# Patient Record
Sex: Male | Born: 1960 | Race: White | Hispanic: No | Marital: Single | Smoking: Current every day smoker
Health system: Southern US, Community
[De-identification: ages and names within clinical notes are randomized; demographics above are authoritative.]

## PROBLEM LIST (undated history)

## (undated) DIAGNOSIS — F32A Depression, unspecified: Secondary | ICD-10-CM

## (undated) DIAGNOSIS — F419 Anxiety disorder, unspecified: Secondary | ICD-10-CM

## (undated) DIAGNOSIS — F329 Major depressive disorder, single episode, unspecified: Secondary | ICD-10-CM

## (undated) DIAGNOSIS — F609 Personality disorder, unspecified: Secondary | ICD-10-CM

---

## 1999-11-11 ENCOUNTER — Inpatient Hospital Stay (HOSPITAL_COMMUNITY): Admission: AD | Admit: 1999-11-11 | Discharge: 1999-11-16 | Payer: Self-pay

## 1999-11-24 ENCOUNTER — Emergency Department (HOSPITAL_COMMUNITY): Admission: EM | Admit: 1999-11-24 | Discharge: 1999-11-24 | Payer: Self-pay | Admitting: Emergency Medicine

## 2010-10-24 ENCOUNTER — Emergency Department: Payer: Self-pay | Admitting: Emergency Medicine

## 2010-12-27 ENCOUNTER — Ambulatory Visit: Payer: Self-pay | Admitting: Unknown Physician Specialty

## 2011-02-02 ENCOUNTER — Inpatient Hospital Stay: Payer: Self-pay | Admitting: Psychiatry

## 2011-04-29 ENCOUNTER — Emergency Department (HOSPITAL_COMMUNITY)
Admission: EM | Admit: 2011-04-29 | Discharge: 2011-04-30 | Disposition: A | Discharge status: Other Acute Inpatient Hospital | Payer: Self-pay | Attending: Emergency Medicine | Admitting: Emergency Medicine

## 2011-04-29 ENCOUNTER — Encounter: Payer: Self-pay | Admitting: *Deleted

## 2011-04-29 DIAGNOSIS — F17200 Nicotine dependence, unspecified, uncomplicated: Secondary | ICD-10-CM | POA: Insufficient documentation

## 2011-04-29 DIAGNOSIS — R45850 Homicidal ideations: Secondary | ICD-10-CM | POA: Insufficient documentation

## 2011-04-29 DIAGNOSIS — R45851 Suicidal ideations: Secondary | ICD-10-CM | POA: Insufficient documentation

## 2011-04-29 HISTORY — DX: Depression, unspecified: F32.A

## 2011-04-29 HISTORY — DX: Personality disorder, unspecified: F60.9

## 2011-04-29 HISTORY — DX: Major depressive disorder, single episode, unspecified: F32.9

## 2011-04-29 HISTORY — DX: Anxiety disorder, unspecified: F41.9

## 2011-04-29 LAB — BASIC METABOLIC PANEL
BUN: 10 mg/dL (ref 6–23)
CO2: 29 mEq/L (ref 19–32)
Calcium: 8.8 mg/dL (ref 8.4–10.5)
Creatinine, Ser: 0.95 mg/dL (ref 0.50–1.35)
GFR calc non Af Amer: >60 mL/min (ref >60)
Glucose, Bld: 94 mg/dL (ref 70–99)
Sodium: 142 mEq/L (ref 135–145)

## 2011-04-29 LAB — DIFFERENTIAL
Eosinophils Absolute: 0.3 10*3/uL (ref 0.0–0.7)
Eosinophils Relative: 3 % (ref 0–5)
Lymphs Abs: 3.4 10*3/uL (ref 0.7–4.0)
Monocytes Absolute: 0.5 10*3/uL (ref 0.1–1.0)

## 2011-04-29 LAB — URINALYSIS, ROUTINE W REFLEX MICROSCOPIC
Nitrite: NEGATIVE
Protein, ur: NEGATIVE mg/dL
Specific Gravity, Urine: 1.015 (ref 1.005–1.030)
Urobilinogen, UA: 0.2 mg/dL (ref 0.0–1.0)

## 2011-04-29 LAB — RAPID URINE DRUG SCREEN, HOSP PERFORMED
Amphetamines: NOT DETECTED
Barbiturates: NOT DETECTED
Opiates: NOT DETECTED
Tetrahydrocannabinol: NOT DETECTED

## 2011-04-29 LAB — URINE MICROSCOPIC-ADD ON

## 2011-04-29 LAB — CBC
HCT: 46.8 % (ref 39.0–52.0)
MCH: 32.8 pg (ref 26.0–34.0)
MCV: 94.7 fL (ref 78.0–100.0)
Platelets: 229 10*3/uL (ref 150–400)
RBC: 4.94 MIL/uL (ref 4.22–5.81)

## 2011-04-29 MED ORDER — ZIPRASIDONE MESYLATE 20 MG IM SOLR
20.0000 mg | Freq: Once | INTRAMUSCULAR | Status: AC | PRN
  Administered 2011-04-29: 20 mg via INTRAMUSCULAR
  Filled 2011-04-29: qty 20

## 2011-04-29 MED ORDER — LORAZEPAM 1 MG PO TABS: 1.0000 mg | ORAL_TABLET | Freq: Three times a day (TID) | ORAL | Status: DC | PRN

## 2011-04-29 MED ORDER — ZOLPIDEM TARTRATE 5 MG PO TABS: 5.0000 mg | ORAL_TABLET | Freq: Every evening | ORAL | Status: DC | PRN

## 2011-04-29 MED ORDER — ONDANSETRON HCL 4 MG PO TABS: 4.0000 mg | ORAL_TABLET | Freq: Three times a day (TID) | ORAL | Status: DC | PRN

## 2011-04-29 MED ORDER — ALUM & MAG HYDROXIDE-SIMETH 200-200-20 MG/5ML PO SUSP: 30.0000 mL | ORAL | Status: DC | PRN

## 2011-04-29 MED ORDER — IBUPROFEN 400 MG PO TABS: 400.0000 mg | ORAL_TABLET | Freq: Three times a day (TID) | ORAL | Status: DC | PRN

## 2011-04-29 MED ORDER — NICOTINE 21 MG/24HR TD PT24: 21.0000 mg | MEDICATED_PATCH | Freq: Every day | TRANSDERMAL | Status: DC

## 2011-04-29 MED ORDER — ACETAMINOPHEN 325 MG PO TABS: 650.0000 mg | ORAL_TABLET | ORAL | Status: DC | PRN

## 2011-04-29 NOTE — ED Provider Notes (Signed)
Scribed for Derek Melter, MD, the patient was seen in room APA03/APA03. This chart was scribed by [employer]. The patient's care started at 18:50  CSN: [id number] Arrival date & time: 04/29/2011  6:47 PM   Chief Complaint  Patient presents with  . Suicidal   HPI Derek Weaver is a 50 y.o. male who presents to the Emergency Department complaining of Suicidal attempt. Patient reports he just cannot deal with all the stress of life that others seem to control. Patient reports HI/SI with plans. States he plans on cutting himself with a razor blade and then take some sleeping pills. Patient also reports some Homicidal ideations. Patient reports leaving the halfway house this morning and has been thinking of how he'd hurt himself all day. Patient reports tobacco use, alcohol use and prescription use. Denies a history of surgery, back pain, abdominal pain, and is without any other complain.  Past Medical History  Diagnosis Date  . Depression   . Anxiety   . Personality disorder     History reviewed. No pertinent past surgical history.  History reviewed. No pertinent family history.  History  Substance Use Topics  . Smoking status: Current Everyday Smoker -- 0.5 packs/day  . Smokeless tobacco: Not on file  . Alcohol Use: 1.8 oz/week    3 Cans of beer per week      Review of Systems  Constitutional: Negative for fever.       10 Systems reviewed and are negative for acute change except as noted in the HPI.  HENT: Negative for congestion.   Eyes: Negative for discharge and redness.  Respiratory: Negative for cough and shortness of breath.   Cardiovascular: Negative for chest pain.  Gastrointestinal: Negative for vomiting and abdominal pain.  Musculoskeletal: Negative for back pain.  Skin: Negative for rash.  Neurological: Negative for syncope, numbness and headaches.  Psychiatric/Behavioral:       Suicidal and Homicidal ideations and plan  All other systems reviewed and are  negative.    Allergies  Review of patient's allergies indicates no known allergies.  Home Medications  No current outpatient prescriptions on file.  BP 147/83  Pulse 111  Temp(Src) 98.8 F (37.1 C) (Oral)  Resp 20  SpO2 97%  Physical Exam  Nursing note and vitals reviewed. Constitutional: He is oriented to person, place, and time. He appears well-developed and well-nourished.       Awake, alert, nontoxic appearance with baseline speech for patient.  HENT:  Head: Normocephalic.  Mouth/Throat: Oropharynx is clear and moist. No oropharyngeal exudate.  Eyes: Conjunctivae and EOM are normal. Pupils are equal, round, and reactive to light. Right eye exhibits no discharge. Left eye exhibits no discharge.  Neck: Normal range of motion. Neck supple.  Cardiovascular: Normal rate and regular rhythm.   No murmur heard. Pulmonary/Chest: Effort normal and breath sounds normal. No stridor. No respiratory distress. He has no wheezes. He has no rales. He exhibits no tenderness.  Abdominal: Soft. Bowel sounds are normal. He exhibits no mass. There is no tenderness. There is no rebound.  Musculoskeletal: Normal range of motion. He exhibits no tenderness.       Cervical back: Normal.       Thoracic back: Normal.       Lumbar back: Normal.       Baseline ROM, moves extremities with no obvious new focal weakness.  Lymphadenopathy:    He has no cervical adenopathy.  Neurological: He is alert and oriented to person, place,  and time. No cranial nerve deficit.       Awake, alert, cooperative and aware of situation; motor strength bilaterally; sensation normal to light touch bilaterally; peripheral visual fields full to confrontation; no facial asymmetry; tongue midline; major cranial nerves appear intact; no pronator drift, normal finger to nose bilaterally, baseline gait without new ataxia.  Skin: Skin is warm and dry. No rash noted. He is not diaphoretic. No erythema.  Psychiatric: He has a normal  mood and affect.       Patient is tearful    ED Course  Procedures  OTHER DATA REVIEWED: Nursing notes, vital signs, and past medical records reviewed.    DIAGNOSTIC STUDIES: Oxygen Saturation is 97% on room air, normal by my interpretation.    LABS / RADIOLOGY:  Results for orders placed during the hospital encounter of 04/29/11  CBC      Component Value Range   WBC 9.5  4.0 - 10.5 (K/uL)   RBC 4.94  4.22 - 5.81 (MIL/uL)   Hemoglobin 16.2  13.0 - 17.0 (g/dL)   HCT 29.5  62.1 - 30.8 (%)   MCV 94.7  78.0 - 100.0 (fL)   MCH 32.8  26.0 - 34.0 (pg)   MCHC 34.6  30.0 - 36.0 (g/dL)   RDW 65.7  84.6 - 96.2 (%)   Platelets 229  150 - 400 (K/uL)  DIFFERENTIAL      Component Value Range   Neutrophils Relative 56  43 - 77 (%)   Neutro Abs 5.3  1.7 - 7.7 (K/uL)   Lymphocytes Relative 35  12 - 46 (%)   Lymphs Abs 3.4  0.7 - 4.0 (K/uL)   Monocytes Relative 5  3 - 12 (%)   Monocytes Absolute 0.5  0.1 - 1.0 (K/uL)   Eosinophils Relative 3  0 - 5 (%)   Eosinophils Absolute 0.3  0.0 - 0.7 (K/uL)   Basophils Relative 0  0 - 1 (%)   Basophils Absolute 0.0  0.0 - 0.1 (K/uL)  BASIC METABOLIC PANEL      Component Value Range   Sodium 142  135 - 145 (mEq/L)   Potassium 3.4 (*) 3.5 - 5.1 (mEq/L)   Chloride 104  96 - 112 (mEq/L)   CO2 29  19 - 32 (mEq/L)   Glucose, Bld 94  70 - 99 (mg/dL)   BUN 10  6 - 23 (mg/dL)   Creatinine, Ser 9.52  0.50 - 1.35 (mg/dL)   Calcium 8.8  8.4 - 84.1 (mg/dL)   GFR calc non Af Amer >60  >60 (mL/min)   GFR calc Af Amer >60  >60 (mL/min)  ETHANOL      Component Value Range   Alcohol, Ethyl (B) 185 (*) 0 - 11 (mg/dL)  URINE RAPID DRUG SCREEN (HOSP PERFORMED)      Component Value Range   Opiates NONE DETECTED  NONE DETECTED    Cocaine NONE DETECTED  NONE DETECTED    Benzodiazepines NONE DETECTED  NONE DETECTED    Amphetamines NONE DETECTED  NONE DETECTED    Tetrahydrocannabinol NONE DETECTED  NONE DETECTED    Barbiturates NONE DETECTED  NONE DETECTED     URINALYSIS, ROUTINE W REFLEX MICROSCOPIC      Component Value Range   Color, Urine YELLOW  YELLOW    Appearance CLEAR  CLEAR    Specific Gravity, Urine 1.015  1.005 - 1.030    pH 6.5  5.0 - 8.0    Glucose, UA NEGATIVE  NEGATIVE (mg/dL)   Hgb urine dipstick TRACE (*) NEGATIVE    Bilirubin Urine NEGATIVE  NEGATIVE    Ketones, ur NEGATIVE  NEGATIVE (mg/dL)   Protein, ur NEGATIVE  NEGATIVE (mg/dL)   Urobilinogen, UA 0.2  0.0 - 1.0 (mg/dL)   Nitrite NEGATIVE  NEGATIVE    Leukocytes, UA NEGATIVE  NEGATIVE   URINE MICROSCOPIC-ADD ON      Component Value Range   WBC, UA 0-2  <3 (WBC/hpf)   RBC / HPF 0-2  <3 (RBC/hpf)   Bacteria, UA RARE  RARE      ED COURSE / COORDINATION OF CARE: 19:00 - EDMD examined patient and ordered the following Orders Placed This Encounter  Procedures  . CBC  . Differential  . Basic metabolic panel  . Ethanol  . Urine rapid drug screen (hosp performed)  . Urinalysis, Routine w reflex microscopic  . Diet regular  . Vital signs with O2 sat q6hours  . Call MD/PA if: (See Comments)  . Full code  19:44 - Patient attempted to leave against medical advice. EDP alerted local police. Commitment papers filled out.    MDM: SI/HI with hx of same. Pt needs assessment by [facility], and likely placement.  IMPRESSION: Diagnoses that have been ruled out:  Diagnoses that are still under consideration:  Final diagnoses:    PLAN:  pending  CONDITION ON DISCHARGE: pending  MEDICATIONS GIVEN IN THE E.D. Medications - No data to display  DISCHARGE MEDICATIONS: New Prescriptions   No medications on file    SCRIBE ATTESTATION:I personally performed the services described in this documentation, which was scribed in my presence. The recorded information has been reviewed and considered. No att. providers found   Derek Melter, MD 04/30/11 1515

## 2011-04-29 NOTE — ED Notes (Addendum)
Pt states he has been having suicidal thoughts today and thoughts of cutting himself. Pt states he had decided to leave the halfway house he has been staying at d/t stress. Pt is tearful and withdrawn. Pt states he feels like there is nothing left for him. Pt has a plan he was going to use a razor that he has on him to cut himself and take all the pills he has with him.

## 2011-04-29 NOTE — ED Notes (Signed)
Pt attempting to leave ama dr notified commitment papers filed out, police called. Pt stating "i cant stop the cravings i want to cut".

## 2011-04-30 ENCOUNTER — Inpatient Hospital Stay (HOSPITAL_COMMUNITY)
Admission: EM | Admit: 2011-04-30 | Discharge: 2011-05-17 | DRG: 897 | Disposition: A | Discharge status: Home / Self Care | Payer: 59 | Source: Other Acute Inpatient Hospital | Attending: Psychiatry | Admitting: Psychiatry

## 2011-04-30 DIAGNOSIS — F1020 Alcohol dependence, uncomplicated: Principal | ICD-10-CM

## 2011-04-30 DIAGNOSIS — Z79899 Other long term (current) drug therapy: Secondary | ICD-10-CM

## 2011-04-30 DIAGNOSIS — F39 Unspecified mood [affective] disorder: Secondary | ICD-10-CM

## 2011-04-30 DIAGNOSIS — F603 Borderline personality disorder: Secondary | ICD-10-CM

## 2011-04-30 DIAGNOSIS — R45851 Suicidal ideations: Secondary | ICD-10-CM

## 2011-04-30 NOTE — Progress Notes (Signed)
0130 Patient with SI/HI. IVC paperwork and holding orders on the chart. He has been seen by [facility]. Awaiting placement. 1610 Patient accepted at [facility], Dr. [name].

## 2011-05-01 DIAGNOSIS — F1020 Alcohol dependence, uncomplicated: Secondary | ICD-10-CM
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
## 2011-05-19 NOTE — Discharge Summary (Signed)
NAMEMarland Kitchen  Derek, Weaver NO.:  [id number]  MEDICAL RECORD NO.:  [id number]  LOCATION:  0304                          FACILITY:  [facility]  PHYSICIAN:  Orson Aloe, MD       DATE OF BIRTH:  1960/10/11  DATE OF ADMISSION:  04/30/2011 DATE OF DISCHARGE:  05/17/2011                              DISCHARGE SUMMARY   IDENTIFYING INFORMATION:  This is a 50 year old divorced Caucasian male. This is an involuntary admission.  HISTORY OF PRESENT ILLNESS:  This is the first [facility] admission for Derek Weaver who has been staying at a halfway house in [place] since his discharge from the [facility] program.  He relapsed on alcohol yesterday after some conflict with a peer and presented in the emergency room with an alcohol level of 185 mg/dL.  He has a history of cutting himself and did not start cutting but was having suicidal thoughts.  He felt he had nothing to live for.  MEDICAL EVALUATION:  He was medically evaluated in our emergency room where he presented with an alcohol level of 185 mg/dL.  Urine drug screen was negative, and physical exam and full review of systems are as documented in the transcript.  Physical exam was unremarkable.  COURSE OF HOSPITALIZATION:  He was admitted to our dual diagnosis unit and initially evaluated by Dr. Elsie Saas.  He was initially drowsy but arousable, appropriately groomed and dressed with normal speech, denying any homicidal thoughts towards his peer and endorsing passive suicidal thoughts but no active plan.  He was given a provisional diagnosis of alcohol abuse and mood disorder NOS.  He also reported he had a history of borderline personality disorder.  He reported he was a habitual felon.  We elected to continue his involuntary commitment.  He reported that he had been taking Topamax which had been prescribed for him to help control his impulses to cut himself.  He felt that it had helped somewhat but that it could be better.  We elected  to increase his Topamax to 50 mg t.i.d. from his previous dose of 50 mg b.i.d.  He presented with hopeless and helpless mood.  He was cooperative while on the unit and compliant with medications.  Participation in group therapy was satisfactory.  He worked with our Sports coach to pursue placement in an alcohol rehab program.  By the 14th, he was denying any suicidal or homicidal thoughts.  By the 17th, he was ready for discharge to go back to the halfway house where he was living.  He admitted that he struggles with fleeting thoughts of suicide but no plan to cut.  He was tolerating medications well with no side effects, and he did complete a Librium detox protocol.  He was started on Risperdal 0.25 mg p.o. b.i.d. to support calm mood with less agitation and less tendency to cut.  He was also started on Celexa 10 mg daily which was titrated to 10 mg in the morning and 20 mg with the evening meal.  He tolerated medications well and was ready for discharge with his suicidality gauged at minimal by October 17th.  DISCHARGE PLAN:  He is to follow up at [facility] on October 19th at 8 a.m.  DISCHARGE DIAGNOSES:  Axis I: 1. Alcohol dependence. 2. Mood disorder, NOS. Axis II:  Borderline personality disorder. Axis III:  No diagnosis. Axis IV:  Moderate chronic limited support system. Axis V:  Current GAF 55, past year not known.  DISCHARGE MEDICATIONS: 1. Citalopram, take 10 mg q.a.m. and 20 mg every p.m. by mouth. 2. Risperdal 0.25 mg b.i.d. 3. Topiramate 50 mg t.i.d. 4. Trazodone 100 mg at bedtime for insomnia.     Margaret A. Lorin Picket, N.P.   ______________________________ Orson Aloe, MD    MAS/MEDQ  D:  05/17/2011  T:  05/17/2011  Job:  [id number]  Electronically Signed by Kari Baars N.P. on 05/18/2011 08:27:37 AM Electronically Signed by Orson Aloe  on 05/19/2011 10:22:39 AM

## 2011-08-02 ENCOUNTER — Other Ambulatory Visit: Payer: Self-pay

## 2011-08-02 ENCOUNTER — Encounter (HOSPITAL_COMMUNITY): Payer: Self-pay | Admitting: Psychiatry

## 2011-08-02 ENCOUNTER — Inpatient Hospital Stay (HOSPITAL_COMMUNITY)
Admission: AD | Admit: 2011-08-02 | Discharge: 2011-08-07 | DRG: 897 | Disposition: A | Discharge status: Home / Self Care | Payer: Federal, State, Local not specified - Other | Source: Ambulatory Visit | Attending: Psychiatry | Admitting: Psychiatry

## 2011-08-02 ENCOUNTER — Emergency Department (HOSPITAL_COMMUNITY)
Admission: EM | Admit: 2011-08-02 | Discharge: 2011-08-02 | Disposition: A | Discharge status: Other Acute Inpatient Hospital | Payer: Self-pay | Attending: Emergency Medicine | Admitting: Emergency Medicine

## 2011-08-02 ENCOUNTER — Encounter (HOSPITAL_COMMUNITY): Payer: Self-pay | Admitting: Emergency Medicine

## 2011-08-02 DIAGNOSIS — F1210 Cannabis abuse, uncomplicated: Principal | ICD-10-CM

## 2011-08-02 DIAGNOSIS — F1020 Alcohol dependence, uncomplicated: Secondary | ICD-10-CM

## 2011-08-02 DIAGNOSIS — F17200 Nicotine dependence, unspecified, uncomplicated: Secondary | ICD-10-CM | POA: Insufficient documentation

## 2011-08-02 DIAGNOSIS — Z79899 Other long term (current) drug therapy: Secondary | ICD-10-CM

## 2011-08-02 DIAGNOSIS — F609 Personality disorder, unspecified: Secondary | ICD-10-CM | POA: Insufficient documentation

## 2011-08-02 DIAGNOSIS — F603 Borderline personality disorder: Secondary | ICD-10-CM

## 2011-08-02 DIAGNOSIS — F329 Major depressive disorder, single episode, unspecified: Secondary | ICD-10-CM | POA: Insufficient documentation

## 2011-08-02 DIAGNOSIS — F411 Generalized anxiety disorder: Secondary | ICD-10-CM | POA: Insufficient documentation

## 2011-08-02 DIAGNOSIS — F1994 Other psychoactive substance use, unspecified with psychoactive substance-induced mood disorder: Secondary | ICD-10-CM

## 2011-08-02 DIAGNOSIS — R45851 Suicidal ideations: Secondary | ICD-10-CM

## 2011-08-02 DIAGNOSIS — F3289 Other specified depressive episodes: Secondary | ICD-10-CM

## 2011-08-02 DIAGNOSIS — T148XXA Other injury of unspecified body region, initial encounter: Secondary | ICD-10-CM | POA: Insufficient documentation

## 2011-08-02 DIAGNOSIS — F489 Nonpsychotic mental disorder, unspecified: Secondary | ICD-10-CM | POA: Insufficient documentation

## 2011-08-02 DIAGNOSIS — F39 Unspecified mood [affective] disorder: Secondary | ICD-10-CM

## 2011-08-02 DIAGNOSIS — X789XXA Intentional self-harm by unspecified sharp object, initial encounter: Secondary | ICD-10-CM | POA: Insufficient documentation

## 2011-08-02 LAB — RAPID URINE DRUG SCREEN, HOSP PERFORMED
Amphetamines: NOT DETECTED
Barbiturates: NOT DETECTED
Tetrahydrocannabinol: POSITIVE — AB

## 2011-08-02 LAB — CBC
Hemoglobin: 14.7 g/dL (ref 13.0–17.0)
MCH: 32 pg (ref 26.0–34.0)
MCV: 95 fL (ref 78.0–100.0)
Platelets: 203 10*3/uL (ref 150–400)
RBC: 4.6 MIL/uL (ref 4.22–5.81)
WBC: 8.3 10*3/uL (ref 4.0–10.5)

## 2011-08-02 LAB — COMPREHENSIVE METABOLIC PANEL
ALT: 17 U/L (ref 0–53)
AST: 17 U/L (ref 0–37)
CO2: 26 mEq/L (ref 19–32)
Calcium: 9.6 mg/dL (ref 8.4–10.5)
Chloride: 103 mEq/L (ref 96–112)
Creatinine, Ser: 0.95 mg/dL (ref 0.50–1.35)
GFR calc Af Amer: >90 mL/min (ref >90)
GFR calc non Af Amer: >90 mL/min (ref >90)
Glucose, Bld: 117 mg/dL — ABNORMAL HIGH (ref 70–99)
Sodium: 139 mEq/L (ref 135–145)
Total Bilirubin: 0.2 mg/dL — ABNORMAL LOW (ref 0.3–1.2)

## 2011-08-02 LAB — SALICYLATE LEVEL: Salicylate Lvl: <2 mg/dL — ABNORMAL LOW (ref 2.8–20.0)

## 2011-08-02 MED ORDER — LORAZEPAM 1 MG PO TABS: 1.0000 mg | ORAL_TABLET | Freq: Three times a day (TID) | ORAL | Status: DC | PRN

## 2011-08-02 MED ORDER — ACETAMINOPHEN 325 MG PO TABS: 650.0000 mg | ORAL_TABLET | ORAL | Status: DC | PRN

## 2011-08-02 MED ORDER — ACETAMINOPHEN 325 MG PO TABS
650.0000 mg | ORAL_TABLET | Freq: Four times a day (QID) | ORAL | Status: DC | PRN
Start: 2011-08-02 — End: 2011-08-07
  Administered 2011-08-03: 650 mg via ORAL

## 2011-08-02 MED ORDER — TRAZODONE HCL 100 MG PO TABS: 100.0000 mg | ORAL_TABLET | Freq: Every day | ORAL | Status: DC

## 2011-08-02 MED ORDER — MAGNESIUM HYDROXIDE 400 MG/5ML PO SUSP: 30.0000 mL | Freq: Every day | ORAL | Status: DC | PRN

## 2011-08-02 MED ORDER — TETANUS-DIPHTH-ACELL PERTUSSIS 5-2.5-18.5 LF-MCG/0.5 IM SUSP
0.5000 mL | Freq: Once | INTRAMUSCULAR | Status: AC
  Administered 2011-08-02: 0.5 mL via INTRAMUSCULAR
  Filled 2011-08-02: qty 0.5

## 2011-08-02 MED ORDER — TRAZODONE HCL 100 MG PO TABS
100.0000 mg | ORAL_TABLET | Freq: Every day | ORAL | Status: DC
  Administered 2011-08-02 – 2011-08-06 (×5): 100 mg via ORAL
  Filled 2011-08-02 (×6): qty 1
  Filled 2011-08-02: qty 14

## 2011-08-02 MED ORDER — CITALOPRAM HYDROBROMIDE 20 MG PO TABS
20.0000 mg | ORAL_TABLET | Freq: Every day | ORAL | Status: DC
  Administered 2011-08-02: 20 mg via ORAL
  Filled 2011-08-02 (×2): qty 1

## 2011-08-02 MED ORDER — RISPERIDONE 0.5 MG PO TABS
0.2500 mg | ORAL_TABLET | Freq: Two times a day (BID) | ORAL | Status: DC
  Administered 2011-08-02: 0.25 mg via ORAL
  Filled 2011-08-02: qty 1

## 2011-08-02 MED ORDER — TOPIRAMATE 25 MG PO TABS
50.0000 mg | ORAL_TABLET | Freq: Two times a day (BID) | ORAL | Status: DC
  Administered 2011-08-02: 50 mg via ORAL
  Filled 2011-08-02: qty 2

## 2011-08-02 MED ORDER — ALUM & MAG HYDROXIDE-SIMETH 200-200-20 MG/5ML PO SUSP: 30.0000 mL | ORAL | Status: DC | PRN

## 2011-08-02 NOTE — ED Notes (Signed)
MD bedside to close wounds

## 2011-08-02 NOTE — Progress Notes (Signed)
Pt flat, depressed cooperative with staff.  Pt complained of pain in right forearm because dressing was too tight.  Pt's right hand was swollen.  Pt's dressing changed and sutures were observed and were dry and intact with some smaller cuts bleeding.  Pt was encouraged to keep his hand/arm elevated to reduce swelling.  Pt stated that he had passive SI but contracted for safety.  Pt isolated to his room and went to sleep.

## 2011-08-02 NOTE — ED Notes (Signed)
[id number]<AV> Expected date:<BR> Expected time:<BR> Means of arrival:<BR> Comments:<BR> EMS/overdose trazadone/self inflicted wounds

## 2011-08-02 NOTE — ED Notes (Signed)
Report to [facility] [place] ED,

## 2011-08-02 NOTE — ED Notes (Signed)
3 bags of belongings placed in left lower cabinet in pt's room and locked-[facility] notified of need for sitter-Dr. Denton Lank in to evaluate pt-pt has been wanded-pt's belongings have been wanded

## 2011-08-02 NOTE — Consult Note (Signed)
Patient Identification:  Derek Weaver Date of Evaluation:  08/02/2011   History of Present Illness:  Derek Weaver is an 51 y.o. male. Pt presenting to [facility] from [facility] with self-inflicted lacerations to both forearms and chest. Pt reported to [facility] that he had taken 10-15 of his Trazadone and Risperdal, but denied this to the EDP. Pt stated "I got tired. I got tired of this life." Pt admitted to having self-harming thoughts all of the time, but denies any AH/VH. Pt was not able to identify any specific stressors or precipitating factor to his SI. Pt admits to using THC 2-3 times a week and states he has been sober for "awhile" citing since 05/07/11. Pt reported he started walking and taking his medication after ever few blocks, would take a few pills. Stated he was on his way walking to [facility] ED, but by the time he got to [facility] he was "really, really tired". Again, pt stated "I'm just so tired of dealing with this life. I don't want to be here no more." Pt reported still feeling this way currently. Denies HI or psychosis.   Patient reported that he did this because of the ongoing multiple distress because of not having calm not liking the place where he lives.  PAST PSYCHIATRIC HISTORY: Apparently he began cutting after he got out  of prison back in 1995. He has had admissions to [facility]. His most  recent admission was August 2012 to [facility].   SOCIAL HISTORY: He states he has 18 months of college. He has been  married and divorced once. He has no children.   FAMILY HISTORY: None.  DATE OF ADMISSION: 04/30/2011  DATE OF DISCHARGE: 05/17/2011  DISCHARGE SUMMARY  IDENTIFYING INFORMATION: This is a 51 year old divorced Caucasian male.  This is an involuntary admission.  HISTORY OF PRESENT ILLNESS: This is the first [facility] admission for Derek Weaver  who has been staying at a halfway house in [place] since his  discharge from the [facility] program. He relapsed on alcohol yesterday  after  some conflict with a peer and presented in the emergency room with  an alcohol level of 185 mg/dL. He has a history of cutting himself and  did not start cutting but was having suicidal thoughts. He felt he had  nothing to live for.  MEDICAL EVALUATION: He was medically evaluated in our emergency room  where he presented with an alcohol level of 185 mg/dL. Urine drug  screen was negative, and physical exam and full review of systems are as  documented in the transcript. Physical exam was  unremarkable.   COURSE OF HOSPITALIZATION: He was admitted to our dual diagnosis unit  and initially evaluated by Dr. Elsie Saas. He was initially drowsy  but arousable, appropriately groomed and dressed with normal speech,  denying any homicidal thoughts towards his peer and endorsing passive  suicidal thoughts but no active plan. He was given a provisional  diagnosis of alcohol abuse and mood disorder NOS. He also reported he  had a history of borderline personality disorder. He reported he was a  habitual felon. We elected to continue his involuntary commitment.  He reported that he had been taking Topamax which had been prescribed  for him to help control his impulses to cut himself. He felt that it  had helped somewhat but that it could be better. We elected to increase  his Topamax to 50 mg t.i.d. from his previous dose of 50 mg b.i.d. He  presented  with hopeless and helpless mood. He was cooperative while on  the unit and compliant with medications. Participation in group therapy  was satisfactory. He worked with our Sports coach to pursue placement  in an alcohol rehab program.  By the 14th, he was denying any suicidal or homicidal thoughts. By the  17th, he was ready for discharge to go back to the halfway house where  he was living. He admitted that he struggles with fleeting thoughts of  suicide but no plan to cut. He was tolerating medications well with no  side effects, and he did  complete a Librium detox protocol. He was  started on Risperdal 0.25 mg p.o. b.i.d. to support calm mood with less  agitation and less tendency to cut. He was also started on Celexa 10 mg  daily which was titrated to 10 mg in the morning and 20 mg with the  evening meal. He tolerated medications well and was ready for discharge  with his suicidality gauged at minimal by October 17th.   DISCHARGE PLAN: He is to follow up at [facility] on October  19th at 8 a.m.   DISCHARGE DIAGNOSES: Axis I:  1. Alcohol dependence.  2. Mood disorder, NOS.  Axis II: Borderline personality disorder.  Axis III: No diagnosis.  Axis IV: Moderate chronic limited support system.  Axis V: Current GAF 55, past year not known.   DISCHARGE MEDICATIONS:  1. Citalopram, take 10 mg q.a.m. and 20 mg every p.m. by mouth.  2. Risperdal 0.25 mg b.i.d.  3. Topiramate 50 mg t.i.d.  4. Trazodone 100 mg at bedtime for insomnia.   Past Medical History:     Past Medical History  Diagnosis Date  . Depression   . Anxiety   . Personality disorder       History reviewed. No pertinent past surgical history.  Filed Vitals:   08/02/11 0720  BP: 128/70  Pulse: 73  Temp: 98.1 F (36.7 C)  Resp: 16    Lab Results:   BMET    Component Value Date/Time   NA 139 08/02/2011 0507   K 3.7 08/02/2011 0507   CL 103 08/02/2011 0507   CO2 26 08/02/2011 0507   GLUCOSE 117* 08/02/2011 0507   BUN 15 08/02/2011 0507   CREATININE 0.95 08/02/2011 0507   CALCIUM 9.6 08/02/2011 0507   GFRNONAA >90 08/02/2011 0507   GFRAA >90 08/02/2011 0507    Allergies: No Known Allergies  Current Medications:  Prior to Admission medications   Medication Sig Start Date End Date Taking? Authorizing Provider  citalopram (CELEXA) 20 MG tablet Take 20 mg by mouth daily.      Historical Provider, MD  risperiDONE (RISPERDAL) 0.25 MG tablet Take 0.25 mg by mouth 2 (two) times daily.      Historical Provider, MD  topiramate (TOPAMAX) 50 MG tablet  Take 50-100 mg by mouth 2 (two) times daily. Pt takes 50mg  in the morning,100mg  in the evening    Historical Provider, MD  traZODone (DESYREL) 100 MG tablet Take 100 mg by mouth at bedtime.      Historical Provider, MD    Social History:    reports that he has been smoking.  He does not have any smokeless tobacco history on file. He reports that he drinks about 1.8 ounces of alcohol per week. He reports that he uses illicit drugs (Marijuana) about 2.5 times per week.   Family History:    No family history on file.  DIAGNOSIS: Mood disorder NOS  Recommendations: Patient started on Celexa Risperdal Topamax and trazodone.  Patient need inpatient stabilization.     Eulogio Ditch, MD

## 2011-08-02 NOTE — ED Provider Notes (Addendum)
History     CSN: [id number]  Arrival date & time 08/02/11  0446   First MD Initiated Contact with Patient 08/02/11 [phone]      Chief Complaint  Patient presents with  . Suicidal  . Drug Overdose    (Consider location/radiation/quality/duration/timing/severity/associated sxs/prior treatment) The history is provided by the patient.  pt states has been feeling depressed and anxious/stress. States thoughts of harming self, and has inflected numerous superficial lacs to self across chest, shoulders and bil arms. Hx cutting self in past. Minimal bleeding stopped pta. Denies overdose of meds. States has had same symptoms on and off x years. Feels worse in past few weeks. Denies single inciting event, and is unaware of exacerbating or alleviating factors regarding his symptoms.   Past Medical History  Diagnosis Date  . Depression   . Anxiety   . Personality disorder     History reviewed. No pertinent past surgical history.  No family history on file.  History  Substance Use Topics  . Smoking status: Current Everyday Smoker -- 0.5 packs/day  . Smokeless tobacco: Not on file  . Alcohol Use: 1.8 oz/week    3 Cans of beer per week      Review of Systems  Constitutional: Negative for fever and chills.  HENT: Negative for neck pain.   Eyes: Negative for redness.  Respiratory: Negative for shortness of breath.   Cardiovascular: Negative for chest pain.  Gastrointestinal: Negative for abdominal pain.  Genitourinary: Negative for flank pain.  Musculoskeletal: Negative for back pain.  Skin: Negative for rash.  Neurological: Negative for headaches.  Hematological: Does not bruise/bleed easily.  Psychiatric/Behavioral: Negative for agitation.    Allergies  Review of patient's allergies indicates no known allergies.  Home Medications   Current Outpatient Rx  Name Route Sig Dispense Refill  . TRAZODONE HCL 50 MG PO TABS Oral Take 50 mg by mouth at bedtime.        BP 126/69   Pulse 74  Temp(Src) 97.6 F (36.4 C) (Oral)  Resp 17  Ht 5\' 10"  (1.778 m)  Wt 180 lb (81.647 kg)  BMI 25.83 kg/m2  SpO2 94%  Physical Exam  Nursing note and vitals reviewed. Constitutional: He is oriented to person, place, and time. He appears well-developed and well-nourished. No distress.  HENT:  Head: Atraumatic.  Eyes: Pupils are equal, round, and reactive to light.  Neck: Neck supple. No tracheal deviation present. Thyromegaly present.  Cardiovascular: Normal rate, normal heart sounds and intact distal pulses.   Pulmonary/Chest: Effort normal and breath sounds normal. No accessory muscle usage. No respiratory distress.  Abdominal: Soft. Bowel sounds are normal. He exhibits no distension.  Musculoskeletal: Normal range of motion.  Neurological: He is alert and oriented to person, place, and time.       Motor intact bil. Steady gait.   Skin: Skin is warm and dry.       Multiple superficial lacerations to bil arms, shoulders and torso. 2 wounds, one on each arm are into subcutan tissue requiring sutures. Distal pulses palp.   Psychiatric:       Pt appears mildly anxious. Pt states he feels depressed.     ED Course  Procedures (including critical care time)   Labs Reviewed  CBC  COMPREHENSIVE METABOLIC PANEL  ETHANOL  ACETAMINOPHEN LEVEL  URINE RAPID DRUG SCREEN (HOSP PERFORMED)  SALICYLATE LEVEL    Results for orders placed during the hospital encounter of 08/02/11  CBC      Component  Value Range   WBC 8.3  4.0 - 10.5 (K/uL)   RBC 4.60  4.22 - 5.81 (MIL/uL)   Hemoglobin 14.7  13.0 - 17.0 (g/dL)   HCT 40.9  81.1 - 91.4 (%)   MCV 95.0  78.0 - 100.0 (fL)   MCH 32.0  26.0 - 34.0 (pg)   MCHC 33.6  30.0 - 36.0 (g/dL)   RDW 78.2  95.6 - 21.3 (%)   Platelets 203  150 - 400 (K/uL)  COMPREHENSIVE METABOLIC PANEL      Component Value Range   Sodium 139  135 - 145 (mEq/L)   Potassium 3.7  3.5 - 5.1 (mEq/L)   Chloride 103  96 - 112 (mEq/L)   CO2 26  19 - 32 (mEq/L)    Glucose, Bld 117 (*) 70 - 99 (mg/dL)   BUN 15  6 - 23 (mg/dL)   Creatinine, Ser 0.86  0.50 - 1.35 (mg/dL)   Calcium 9.6  8.4 - 57.8 (mg/dL)   Total Protein 7.1  6.0 - 8.3 (g/dL)   Albumin 4.1  3.5 - 5.2 (g/dL)   AST 17  0 - 37 (U/L)   ALT 17  0 - 53 (U/L)   Alkaline Phosphatase 82  39 - 117 (U/L)   Total Bilirubin 0.2 (*) 0.3 - 1.2 (mg/dL)   GFR calc non Af Amer >90  >90 (mL/min)   GFR calc Af Amer >90  >90 (mL/min)  ETHANOL      Component Value Range   Alcohol, Ethyl (B) <11  0 - 11 (mg/dL)  ACETAMINOPHEN LEVEL      Component Value Range   Acetaminophen (Tylenol), Serum <15.0  10 - 30 (ug/mL)  SALICYLATE LEVEL      Component Value Range   Salicylate Lvl <2.0 (*) 2.8 - 20.0 (mg/dL)   No results found.    MDM  Pt states tetanus unknown. Tetanus im.  Wounds sutured.    LACERATION REPAIR Performed by: Suzi Roots Authorized by: Suzi Roots Consent: Verbal consent obtained. Risks and benefits: risks, benefits and alternatives were discussed Consent given by: patient Patient identity confirmed: provided demographic data Prepped and Draped in normal sterile fashion Wound explored  Laceration Location: forearm  Laceration Length: 6 cm  No Foreign Bodies seen or palpated  Anesthesia: local infiltration  Local anesthetic: lidocaine 2% w epinephrine  Anesthetic total: 6 ml  Irrigation method: syringe Amount of cleaning: standard  Skin closure: 4-0 prolene  Number of sutures: 9  Technique: simple interrupted  Patient tolerance: Patient tolerated the procedure well with no immediate complications.    Date: 08/02/2011  Rate: 68  Rhythm: normal sinus rhythm  QRS Axis: normal  Intervals: normal  ST/T Wave abnormalities: normal  Conduction Disutrbances:none  Narrative Interpretation:   Old EKG Reviewed: none available    ACT team called-will assess/place patient.  Signed out to morning EDP, Dr Oletta Lamas to follow up with ACT team re pt placement.     Suzi Roots, MD 08/02/11 [id number]  Suzi Roots, MD 08/02/11 [phone]

## 2011-08-02 NOTE — Progress Notes (Signed)
Patient ID: Derek Weaver, male   DOB: 11-29-1960, 51 y.o.   MRN: [id number] 51 yo male who came to [facility] for SI with multiple cuts to arms and upper chest.  Wounds sutured and treated in the ED, pain a 4/10 for burning incision pain.  Pt came from a recovery house.  He denies HI and AVH on admission.  Derek Weaver does c/o SI but contracts for safety and agrees to contact a staff member if he feels like acting on his SI.  Pt has a sad affect, cooperative.  He does not have any medical issues.

## 2011-08-02 NOTE — ED Notes (Signed)
TC to [facility] and informed no beds per Beverly.  TC to [facility] and spoke with Robin, who stated they had no beds today.  TC to [facility] & informed no beds currently, but possibly after 1:00pm after discharges.  TC to [facility] & spoke with Michelle who stated they do have beds. TC to [facility] & spoke with Kathy who stated they have beds today.  

## 2011-08-02 NOTE — BH Assessment (Addendum)
Assessment Note   Derek Weaver is an 51 y.o. male. Pt presenting to [facility] from [facility] with self-inflicted lacerations to both forearms and chest. Pt reported to [facility] & ACT that he had taken 10-15 of his Trazadone and Risperdal, but denied this to the EDP. Pt stated "I got tired. I got tired of this life." Pt admitted to having self-harming thoughts all of the time, but denies any AH/VH. Pt was not able to identify any specific stressors or precipitating factor to his SI. Pt admits to using THC 2-3 times a week and states he has been sober for "awhile" citing since 05/07/11. Pt reported he started walking and taking his medication after ever few blocks, would take a few pills. Stated he was on his way walking to [facility] ED, but by the time he got to [facility] he was "really, really tired". Again, pt stated "I'm just so tired of dealing with this life. I don't want to be here no more." Pt reported still feeling this way currently. Denies HI or psychosis.  Axis I: Major Depressive Disorder, THC Abuse Axis II: Cluster B Traits Axis III:  Past Medical History  Diagnosis Date  . Depression   . Anxiety   . Personality disorder    Axis IV: other psychosocial or environmental problems and problems with primary support group Axis V: 31-40 impairment in reality testing  Past Medical History:  Past Medical History  Diagnosis Date  . Depression   . Anxiety   . Personality disorder     History reviewed. No pertinent past surgical history.  Family History: No family history on file.  Social History:  reports that he has been smoking.  He does not have any smokeless tobacco history on file. He reports that he drinks about 1.8 ounces of alcohol per week. He reports that he uses illicit drugs (Marijuana) about 2.5 times per week.  Additional Social History:  Alcohol / Drug Use Pain Medications: N/A Prescriptions: N/A Over the Counter: N/A History of alcohol / drug use?: Yes Longest period of  sobriety (when/how long): Current from 05/07/11 Substance #1 Name of Substance 1: THC 1 - Age of First Use: Teens 1 - Amount (size/oz): blunt 1 - Frequency: 2-3x/week 1 - Duration: years 1 - Last Use / Amount: 07/30/11 Allergies: No Known Allergies  Home Medications:  Medications Prior to Admission  Medication Dose Route Frequency Provider Last Rate Last Dose  . acetaminophen (TYLENOL) tablet 650 mg  650 mg Oral Q4H PRN Suzi Roots, MD      . LORazepam (ATIVAN) tablet 1 mg  1 mg Oral Q8H PRN Suzi Roots, MD      . TDaP Leda Min) injection 0.5 mL  0.5 mL Intramuscular Once Suzi Roots, MD   0.5 mL at 08/02/11 0543   No current outpatient prescriptions on file as of 08/02/2011.    OB/GYN Status:  No LMP for male patient.  General Assessment Data Location of Assessment: [facility] Living Arrangements: Non-Relatives Can pt return to current living arrangement?: Yes Admission Status: Voluntary Is patient capable of signing voluntary admission?: Yes Transfer from: [facility] Referral Source: [facility]  Education Status Is patient currently in school?: No  Risk to self Suicidal Ideation: Yes-Currently Present Suicidal Intent: Yes-Currently Present Is patient at risk for suicide?: Yes Suicidal Plan?: Yes-Currently Present Specify Current Suicidal Plan: Cutting self and/or OD Access to Means: Yes Specify Access to Suicidal Means: knives, sharps, Rx, OTC What has been your use  of drugs/alcohol within the last 12 months?: THC - 2-3x/wk; ETOH but none since 05/07/11 Previous Attempts/Gestures: Yes How many times?: 1  (multiple) Other Self Harm Risks: history of cutting Triggers for Past Attempts: Unpredictable;Other (Comment) ("Life in General") Intentional Self Injurious Behavior: Cutting Comment - Self Injurious Behavior: cutting, current and scarring on arms Family Suicide History: No (Pt denies) Recent stressful life event(s): Other (Comment) (work, but nothing  specific pt will relate to) Persecutory voices/beliefs?: No Depression: Yes Depression Symptoms: Despondent;Fatigue;Loss of interest in usual pleasures;Feeling worthless/self pity;Feeling angry/irritable Substance abuse history and/or treatment for substance abuse?: Yes Suicide prevention information given to non-admitted patients: Not applicable  Risk to Others Homicidal Ideation: No Thoughts of Harm to Others: No Current Homicidal Intent: No Current Homicidal Plan: No Access to Homicidal Means: No Identified Victim: n/a History of harm to others?: No Assessment of Violence: None Noted Violent Behavior Description: n/a Does patient have access to weapons?: No Criminal Charges Pending?: No Does patient have a court date: No  Psychosis Hallucinations: None noted Delusions: None noted  Mental Status Report Appear/Hygiene: Disheveled;Poor hygiene Eye Contact: Poor Motor Activity: Psychomotor retardation;Unsteady Speech: Slurred Level of Consciousness: Drowsy Mood: Depressed;Sad Affect: Blunted;Depressed Anxiety Level: Moderate Thought Processes: Coherent;Relevant Judgement: Impaired Orientation: Person;Place;Time;Situation Obsessive Compulsive Thoughts/Behaviors: None  Cognitive Functioning Concentration: Normal Memory: Recent Intact;Remote Intact IQ: Average Insight: Poor Impulse Control: Poor Appetite: Fair Weight Loss: 0  Weight Gain: 0  Sleep: No Change Total Hours of Sleep: 7  Vegetative Symptoms: None  Prior Inpatient Therapy Prior Inpatient Therapy: Yes Prior Therapy Dates: Reported Oct 2012 Prior Therapy Facilty/Provider(s): [facility] Reason for Treatment: SA. SI, Depression  Prior Outpatient Therapy Prior Outpatient Therapy: Yes Prior Therapy Dates: Current Prior Therapy Facilty/Provider(s): [facility] Reason for Treatment: Depression, SI  ADL Screening (condition at time of admission) Patient's cognitive ability adequate to safely complete daily  activities?: Yes Patient able to express need for assistance with ADLs?: Yes Independently performs ADLs?: Yes Weakness of Legs: None Weakness of Arms/Hands: None  Home Assistive Devices/Equipment Home Assistive Devices/Equipment: None    Abuse/Neglect Assessment (Assessment to be complete while patient is alone) Physical Abuse: Denies Verbal Abuse: Denies Sexual Abuse: Denies Exploitation of patient/patient's resources: Denies Self-Neglect: Denies Values / Beliefs Cultural Requests During Hospitalization: None Spiritual Requests During Hospitalization: None   Advance Directives (For Healthcare) Advance Directive: Patient does not have advance directive;Patient would not like information Pre-existing out of facility DNR order (yellow form or pink MOST form): No    Additional Information 1:1 In Past 12 Months?: No CIRT Risk: No Elopement Risk: No Does patient have medical clearance?: Yes     Disposition:  Disposition Disposition of Patient: Other dispositions (Dr. Rogers Blocker to evaluate pt)  Pt accepted by Dr. Rogers Blocker to Dr. [name] ([id number]). Completed support documentation and updated EDP.  On Site Evaluation by:   Reviewed with Physician:     Romeo Apple 08/02/2011 11:58 AM

## 2011-08-02 NOTE — ED Notes (Signed)
Pt states that he has felt suicidal most all of his life and has had previous SA.  States that his SI has gotten worse over the last two weeks says "I'm tired of dealing with it".  Has bandages on bilat forearms.  Denies HI.

## 2011-08-02 NOTE — ED Notes (Signed)
Pt alert, presents via EMS from [place], pt SI. ? OD prescription pills, resp even unlabored, ambulates to room with EMS superficial lacerations to various areas of body, bleeding stopped

## 2011-08-02 NOTE — H&P (Signed)
Psychiatric Admission Assessment Adult  Patient Identification:  Derek Weaver Date of Evaluation:  08/02/2011  51 yo DWM History of Present Illness:: Walked to [place] from his group home. Had cut on his forearms and reported that he had overdosed on his prescribed meds. Once at ED he recanted the info about overdose but said he was suicidal. Is chronically so and when last here 9/30-1017/12 cut on himself while inpatient.Has been a cutter since released from prison in 1995.Since his discharge he has been at a halfway house and has had employment through a temp agency.An exterminator company was recently at the halfway house to get rid of bed bugs. The patient and another resident were so helpful that they were offered employment. He thinks he will hear this Friday if he will get this permanent job.This involves moving out furniture etc and replacing once the cleaning/repai etc is done . Company might be [employer].  He did not go to therapy at [facility] as he was working. Says he goes to AAbut did not think to call his sponsor.   Past Psychiatric History:Numerous ina ann outpatient since released from prison 1995.  Substance Abuse History:  Social History:    reports that he has been smoking Cigarettes.  He has a 13 pack-year smoking history. He does not have any smokeless tobacco history on file. He reports that he uses illicit drugs (Marijuana) about once per week. He reports that he does not drink alcohol. UDS+THC  Family Psych History:Denies  Past Medical History:     Past Medical History  Diagnosis Date  . Depression   . Anxiety   . Personality disorder       No past surgical history on file.  Allergies: No Known Allergies  Current Medications:  Prior to Admission medications   Medication Sig Start Date End Date Taking? Authorizing Provider  citalopram (CELEXA) 20 MG tablet Take 20 mg by mouth daily.      Historical Provider, MD  risperiDONE (RISPERDAL) 0.25 MG tablet Take 0.25  mg by mouth 2 (two) times daily.      Historical Provider, MD  topiramate (TOPAMAX) 50 MG tablet Take 50-100 mg by mouth 2 (two) times daily. Pt takes 50mg  in the morning,100mg  in the evening    Historical Provider, MD  traZODone (DESYREL) 100 MG tablet Take 100 mg by mouth at bedtime.      Historical Provider, MD    Mental Status Examination/Evaluation: Objective:  Appearance: Fairly Groomed  Psychomotor Activity:  Normal  Eye Contact::  Good  Speech:  Clear and Coherent  Volume:  Normal  Mood: reports depression with SI    Affect:  Non-Congruent    Thought Process:  Somewhat clear rational goal oriented   Orientation:  Full  Thought Content: denies AVH and no delusions noted   Suicidal Thoughts:  Yes.  without intent/plan  Homicidal Thoughts:  No  Judgement:  Impaired  Insight:  Shallow    DIAGNOSIS:    AXIS I Depressive Disorder NOS and Rule out Major Depression  AXIS II Borderline Personality Dis.  AXIS III See medical history.  AXIS IV economic problems  AXIS V 41-50 serious symptoms    Treatment Plan Summary: Admit for safety & stabilization. Adjust meds  as indicated. Try to find a therapist. Says he can go back to the [facility] and still hopes to get this job. Very ambivalent about all the upcoming changes this employment will bring.  Agree with H&P from ED -numerous self inflicted  superficial lacerations both forearms.Dress with kling gauze which caused bleeding -will redress with Telfa and use [place] first if available.   Mickie Deery Kendryck Lacroix PA-C

## 2011-08-02 NOTE — ED Notes (Signed)
MD @ bedside to eval

## 2011-08-02 NOTE — ED Notes (Signed)
Sleeping, arouses easily.

## 2011-08-02 NOTE — ED Notes (Signed)
Pt presents with multiple laceration to chest, bilateral arms, right inner forearm, two lacerations noted to be 3cm in length, open, bleeding stopped, left forearm two lacerations noted to be 4cm in length, open, bleeding controlled dsd, areas cleansed with 0.9 ns, dsd applied, pt belongings removed, security notified

## 2011-08-02 NOTE — ED Notes (Signed)
Security @ bedside

## 2011-08-02 NOTE — ED Notes (Signed)
Located a fax in chart from [facility] stating that pt is to be returned after medical clearance. [facility] staff who contacted ED: Charleston Ropes, RN.  TC to [place] [phone]). Spoke with [place], one of the social workers. Let her know I was inquiring if pt was able to return to their care after medical clearance. She placed me on hold to speak with one of the RNs. Transferred to [facility], one of the RNs. Stated she did not have any information on this patient other than the same one sheet we have here in his chart. Pam stated that per her pass off information, pt was not admitted to their facility and is not under IVC. Pt will need to be evaluated by ACT. She did state that if pt wanted to return to them, he would have to come freely on his own and then they would re-evaluate him. Let her know we would assess to find out his need for level of care.

## 2011-08-02 NOTE — ED Notes (Signed)
Two patient belonging bags locked in activity room. 

## 2011-08-02 NOTE — Tx Team (Signed)
Initial Interdisciplinary Treatment Plan  PATIENT STRENGTHS: (choose at least two) Ability for insight Average or above average intelligence Capable of independent living Communication skills Physical Health  PATIENT STRESSORS: Financial difficulties Marital or family conflict   PROBLEM LIST: Problem List/Patient Goals Date to be addressed Date deferred Reason deferred Estimated date of resolution  Depression                                                       DISCHARGE CRITERIA:  Ability to meet basic life and health needs Adequate post-discharge living arrangements Improved stabilization in mood, thinking, and/or behavior Motivation to continue treatment in a less acute level of care Need for constant or close observation no longer present Reduction of life-threatening or endangering symptoms to within safe limits Verbal commitment to aftercare and medication compliance  PRELIMINARY DISCHARGE PLAN: Outpatient therapy Return to previous living arrangement  PATIENT/FAMIILY INVOLVEMENT: This treatment plan has been presented to and reviewed with the patient, Derek Weaver, and/or family member.  The patient and family have been given the opportunity to ask questions and make suggestions.  Dyke Maes 08/02/2011, 7:26 PM

## 2011-08-03 MED ORDER — RISPERIDONE 0.25 MG PO TABS
0.2500 mg | ORAL_TABLET | Freq: Two times a day (BID) | ORAL | Status: DC
  Administered 2011-08-03 – 2011-08-07 (×9): 0.25 mg via ORAL
  Filled 2011-08-03 (×12): qty 1
  Filled 2011-08-03 (×2): qty 28

## 2011-08-03 MED ORDER — BACITRACIN-NEOMYCIN-POLYMYXIN OINTMENT TUBE
TOPICAL_OINTMENT | Freq: Every day | CUTANEOUS | Status: DC
  Administered 2011-08-03: 11:00:00 via TOPICAL
  Administered 2011-08-04 – 2011-08-05 (×2): 1 via TOPICAL
  Administered 2011-08-06: 08:00:00 via TOPICAL
  Administered 2011-08-07: 1 via TOPICAL
  Filled 2011-08-03: qty 15

## 2011-08-03 MED ORDER — TOPIRAMATE 25 MG PO TABS: 50.0000 mg | ORAL_TABLET | Freq: Two times a day (BID) | ORAL | Status: DC

## 2011-08-03 MED ORDER — TOPIRAMATE 25 MG PO TABS
50.0000 mg | ORAL_TABLET | Freq: Every evening | ORAL | Status: DC
  Administered 2011-08-03 – 2011-08-06 (×4): 50 mg via ORAL
  Filled 2011-08-03 (×7): qty 2

## 2011-08-03 MED ORDER — CITALOPRAM HYDROBROMIDE 20 MG PO TABS
20.0000 mg | ORAL_TABLET | Freq: Every day | ORAL | Status: DC
  Administered 2011-08-03 – 2011-08-07 (×5): 20 mg via ORAL
  Filled 2011-08-03 (×4): qty 1
  Filled 2011-08-03: qty 14
  Filled 2011-08-03 (×3): qty 1

## 2011-08-03 MED ORDER — TOPIRAMATE 100 MG PO TABS
100.0000 mg | ORAL_TABLET | Freq: Every day | ORAL | Status: DC
  Administered 2011-08-03 – 2011-08-07 (×5): 100 mg via ORAL
  Filled 2011-08-03: qty 21
  Filled 2011-08-03 (×7): qty 1

## 2011-08-03 NOTE — Tx Team (Addendum)
Interdisciplinary Treatment Plan Update (Adult)  Date:  08/04/11  Time Reviewed:  10:19 AM   Progress in Treatment: Attending groups: Yes Participating in groups:  Yes Taking medication as prescribed: Yes Tolerating medication:  Yes Family/Significant othe contact made:  Counselor assessing for appropriate contact Patient understands diagnosis:  Yes Discussing patient identified problems/goals with staff:  Yes Medical problems stabilized or resolved:  Yes Denies suicidal/homicidal ideation: Yes Issues/concerns per patient self-inventory:  None identified Other: N/A  New problem(s) identified: None Identified  Reason for Continuation of Hospitalization: Anxiety Depression Medication stabilization Suicidal ideation  Interventions implemented related to continuation of hospitalization: mood stabilization, medication monitoring and adjustment, group therapy and psycho education, safety checks q 15 mins  Additional comments: N/A  Estimated length of stay: 3-5 days  Discharge Plan: SW will assess for appropriate referrals  New goal(s): N/A  Review of initial/current patient goals per problem list:    1.  Goal(s): Reduce depressive symptoms  Met:  No  Target date: by discharge  As evidenced by: Reducing depression from a 10 to a 3 as reported by pt. Pt ranks at an 8 today.   2.  Goal (s): Reduce/Eliminate suicidal ideation  Met:  No  Target date: by discharge  As evidenced by: pt reporting no SI.    3.  Goal(s): Reduce anxiety  Met:  No  Target date: by discharge  As evidenced by: Reduce anxiety from a 10 to a 3 as reported by pt. Pt ranks at a 6 today.    Attendees: Patient:  Derek Weaver 08/04/2011 11:42 AM   Family:     Physician:  Orson Aloe, MD  08/04/11  10:19 AM   Nursing:   Carolynn Comment, RN 08/04/11   10:24 AM   Case Manager:  Reyes Ivan, LCSWA 08/04/11    10:19 AM   Counselor:  Vanetta Mulders, LPCA 08/04/11    10:19 AM   Other:  Juline Patch, LCSW  08/04/11    10:19 AM   Other:  [name], NP 08/04/2011 11:42 AM   Other:  Neill Loft, RN 08/04/2011 11:42 AM   Other:      Scribe for Treatment Team:   Carmina Miller, 08/03/2011 , 10:19 AM

## 2011-08-03 NOTE — Progress Notes (Signed)
Adult Psychosocial Assessment Update Interdisciplinary Team  [facility] admissions/discharges:  Admissions Discharges  Date:    04/30/11 Date:    05/17/11  Date: Date:  Date: Date:  Date: Date:  Date: Date:   Changes since the last Psychosocial Assessment (including adherence to outpatient mental health and/or substance abuse treatment, situational issues contributing to decompensation and/or relapse). Garnell went to [facility] and then to a halfway house, where he is still living. 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 Shown recently became suicidal, stating that life is too over[employer]oll Brothers and he is tired of dealing with it. He is not able to identify what is overwhelming  Or if there are any particular stressors he is dealing with. It is unclear whether Zein Helbing an overdose before walking in to [place], or just thought about overdosing.   Discharge Plan 1. Will you be returning to the same living situation after discharge?   Yes:   X No:      If no, what is your plan?    Ebony can return to the halfway house where he has been living        2. Would you like a referral for services when you are discharged? Yes:  X   If yes, for what services?  No:        Appointments made with [facility] after last discharge but he never followed up. Will   Need to reset appointments with [facility] for medications and therapy.     Summary and Recommendations (to be completed by the evaluator) Gordie is a 51 year old divorced male diagnosed with Major Depressive Disorder. He   Reports he has also been previously diagnosed with Borderline Personality Disorder.  Although he does not know what triggered it, his chronic suicidal thoughts have become   Worse lately. He reports being tired of dealing with the thoughts and with life in general,   So decided to walk to [facility] ED. There is some  report that he took many pills on the   Walk between the halfway house and [place], but he reports that he did not. Hashir   Would benefit from crisis staibliziation, medication evaluation, therapy groups for   Processing thoughts/feelings/experiences, psychoed groups for coping skills and case  Management for discharge planning.        Signature:  [name], 08/03/2011 9:28 AM

## 2011-08-03 NOTE — Progress Notes (Signed)
Resting quietly with eyes closed. Respirations even and unlabored. No distress noted.  

## 2011-08-03 NOTE — Progress Notes (Signed)
[facility] Group Notes: (Counselor/Nursing/MHT/Case Management/Adjunct)  08/03/11 @ 11:00am  Type of Therapy:  Group Therapy  Participation Level:  None  Participation Quality:  Attentive  Affect:  Depressed  Cognitive:  Appropriate  Insight:  None  Engagement in Group:None  Engagement in Therapy:  None  Modes of Intervention:  Support and Exploration  Summary of Progress/Problems: Axiel  was attentive but not engaged in group process    [name] 08/03/2011  2:28 PM

## 2011-08-03 NOTE — Progress Notes (Signed)
Patient ID: Derek Weaver, male   DOB: 1960-09-02, 50 y.o.   MRN: [id number] Pt is asleep in bed this AM. Pt has not been attending groups today. Pt states that he has no HI and AVH but endorses passive SI. Pt does contract for safety and says he would not harm himself while at [facility]. Pt also says that he does have a suicide plan if "he can't get his life back together." When asked was reluctant to reveal his plan but stated that if by a predetermined date he is not back on track he has a blanket in the woods with sleeping pills and ETOH and that he will go into the woods and "go to sleep." Pt would not tell me what the date was, but it is less than a year from now. A year is "longer than he could wait." Pt did say that the WRAP groups he was attending were very helpful but his work schedule interfered and he stopped going. Writer encouraged pt to utilize his resources and make his WRAP groups a priority. Writer changed dressings to arms bilaterally and applied antibiotic ointment per instruction. Writer will continue to monitor.

## 2011-08-03 NOTE — Progress Notes (Signed)
[facility] Group Notes: (Counselor/Nursing/MHT/Case Management/Adjunct) 08/03/2011   @1 :15pm  Type of Therapy:  Group Therapy  Participation Level:  DID NOT ATTEND   [name] 08/03/2011  3:56 PM

## 2011-08-04 DIAGNOSIS — F329 Major depressive disorder, single episode, unspecified: Secondary | ICD-10-CM

## 2011-08-04 MED ORDER — NICOTINE 21 MG/24HR TD PT24
21.0000 mg | MEDICATED_PATCH | Freq: Every day | TRANSDERMAL | Status: DC
  Administered 2011-08-04: 21 mg via TRANSDERMAL

## 2011-08-04 MED ORDER — NICOTINE 21 MG/24HR TD PT24
21.0000 mg | MEDICATED_PATCH | Freq: Every day | TRANSDERMAL | Status: DC
  Administered 2011-08-06 – 2011-08-07 (×2): 21 mg via TRANSDERMAL
  Filled 2011-08-04 (×5): qty 1

## 2011-08-04 NOTE — Progress Notes (Signed)
Recreation Therapy Notes  08/04/2011         Time: 1415      Group Topic/Focus: The focus of this group is on discussing various styles of communication and communicating assertively using 'I' (feeling) statements.  Participation Level: Active  Participation Quality: Appropriate  Affect: Appropriate  Cognitive: Oriented   Additional Comments: patient smiling, reports being slightly disappointed he wasn't able to discharge today.    Corday Wyka 08/04/2011 3:41 PM

## 2011-08-04 NOTE — Progress Notes (Signed)
[facility] MD Progress Note  08/04/2011 4:08 PM  ADL's:  Intact  Sleep:  Yes,  AEB:  Appetite:  Yes,  AEB:  Suicidal Ideation:   Plan:  No  Intent:  No  Means:  No  Homicidal Ideation:   Plan:  No  Intent:  No  Means:  No  AEB (as evidenced by):  Mental Status: General Appearance Derek Weaver:  Neat Eye Contact:  Good Motor Behavior:  Normal Speech:  Normal Level of Consciousness:  Alert Mood:  Dysphoric Affect:  Appropriate Anxiety Level:  Minimal Thought Process:  Coherent Thought Content:  WNL Perception:  Normal Judgment:  Fair Insight:  Present Cognition:  Orientation time, place and person Sleep:  Number of Hours: 6.75   Vital Signs:Blood pressure 116/72, pulse 88, temperature 97.1 F (36.2 C), temperature source Oral, resp. rate 18, height 5\' 10"  (1.778 m), weight 79.379 kg (175 lb), SpO2 97.00%.  Lab Results: No results found for this or any previous visit (from the past 48 hour(s)).  Physical Findings: AIMS:  , ,  ,  ,    CIWA:    COWS:     Treatment Plan Summary: Daily contact with patient to assess and evaluate symptoms and progress in treatment Medication management Continue to observe while his mood improves  Plan: Monitor while his mood improves.  Derek Weaver 08/04/2011, 4:08 PM

## 2011-08-04 NOTE — Progress Notes (Signed)
Pt attended discharge planning group and actively participated.  Pt presents with flat affect and depressed mood.  Pt ranks depression at an 8 and anxiety at a 6 today.  Pt denies SI but states he always has SI but none yet today.  Pt was open with sharing reason for entering the hospital.  Pt states he became very depressed and cut himself.  Pt states he was staying at a halfway house, [facility] in [place].  Pt is confident he can go back.  Pt states medication helps him feel better.  Pt states he walked here to get help.  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  Pt states he was following up at [facility] and the [employer].  Pt states he wants to continue following up there but had a hard time doing so because of his work schedule.  SW and pt called Winferd Humphrey, owner of [facility] together after group.  Pt was open with telling him what happened, and stated he was proud of himself for not using, despite cutting himself.  Winferd Humphrey was supportive and said pt can come back when stable.  Rhae Hammock also states a requirement for pt to come back is to meet with his peer specialist weekly, regardless of his work schedule.  Pt agrees to this plan.  SW will refer pt to [facility] for medication management and therapy.  No further needs at this time.   Reyes Ivan, LCSWA 08/04/2011  9:45 AM

## 2011-08-04 NOTE — Progress Notes (Signed)
Patient ID: Derek Weaver, male   DOB: Jan 23, 1961, 51 y.o.   MRN: [id number] Pt reports fair sleep and good apetite.  Pt reports depression at 8 and hopelessness at 6.  Pt's energy level is normal and ability to pay attention is improving. Pt endorses** suicidal ideation. Pt contracts for safety

## 2011-08-04 NOTE — Progress Notes (Signed)
Patient ID: Derek Weaver, male   DOB: 26-Sep-1960, 51 y.o.   MRN: [id number] Pt. Went to [employer], reports pain in arms as he laid on them. Medication given (see MAR). Pt. Denies SHI. Staff will monitor q93min for safety.

## 2011-08-04 NOTE — Progress Notes (Signed)
[facility] Group Notes:  (Counselor/Nursing/MHT/Case Management/Adjunct)  08/04/2011 2:55 PM  Type of Therapy:  Group Therapy  Participation Level:  Active  Participation Quality:  Appropriate and Attentive  Affect:  Appropriate  Cognitive:  Oriented  Insight:  Limited  Engagement in Group:  Good  Engagement in Therapy:  Good  Modes of Intervention:  Problem-solving, Support and exploration  Summary of Progress/Problems: Derek Weaver attended group therapy on processing feelings around relapse and recovery, pt was able to share that for her relapse looks like returning and repeating old patterns. Pt shared he sometimes does not know where he goes wrong and feels that things just happen, pt encouraged to take control of his life and to ,ake new patterns that are healthy. Pt lacked insight but was attentive.    [name] 08/04/2011, 2:55 PM

## 2011-08-04 NOTE — Progress Notes (Signed)
[facility] Group Notes:  (Counselor/Nursing/MHT/Case Management/Adjunct)  08/04/2011 3:34 PM  Type of Therapy:  Psychoeducational Skills  Participation Level:  Minimal  Participation Quality:  Attentive  Affect:  Flat  Cognitive:  Appropriate  Insight:  Limited  Engagement in Group:  Limited  Engagement in Therapy:  Limited  Modes of Intervention:  Education, Problem-solving and Support  Summary of Progress/Problems: Pt participated in psychoeducation on DBT wisemind and also received info. From therapist on the [facility]. Pt shared he feels that most of the time he is rational, however his recent cutting was neither rational or emotional because he said he did not know he was doing it until he was done and stated he was not drinking ot on anything- pt shared he feels a lack of control at times. Vanetta Mulders, LPCA   Derek Weaver Derek Weaver 08/04/2011, 3:34 PM

## 2011-08-04 NOTE — Progress Notes (Signed)
[facility] Family/Significant Other Suicide Prevention Education  Suicide Prevention Education:  Education Completed; by Syliva Overman a leader of the half-way house called [facility] [phone] has been identified by the patient as the family member/significant other with whom the patient will be residing, and identified as the person(s) who will aid the patient in the event of a mental health crisis (suicidal ideations/suicide attempt).  With written consent from the patient, the family member/significant other has been provided the following suicide prevention education, prior to the and/or following the discharge of the patient.  The suicide prevention education provided includes the following:  Suicide risk factors  Suicide prevention and interventions  National Suicide Hotline telephone number  [facility] assessment telephone number  [facility] Emergency Assistance 911  [facility] and/or Residential Mobile Crisis Unit telephone number  Request made of family/significant other to:  Remove weapons (e.g., guns, rifles, knives), all items previously/currently identified as safety concern.    Remove drugs/medications (over-the-counter, prescriptions, illicit drugs), all items previously/currently identified as a safety concern.  The family member/significant other verbalizes understanding of the suicide prevention education information provided.  The family member/significant other agrees to remove the items of safety concern listed above. Jeanene Erb stated that the pt does not have access to weapons. Vanetta Mulders, LPCA   Janel Beane Garret Reddish 08/04/2011, 4:12 PM

## 2011-08-05 DIAGNOSIS — F1210 Cannabis abuse, uncomplicated: Principal | ICD-10-CM

## 2011-08-05 NOTE — Progress Notes (Signed)
[facility] Group Notes:  (Counselor/Nursing/MHT/Case Management/Adjunct)  08/05/2011 10:52 AM  Type of Therapy:  After care Planning group Pt. attended after care planning group and was given [place] Suicide Prevention Information with crisis and hotline numbers to use. The patients agreed to use them if needed. Pt. Stated he was due to leave hospital this week but that his d/c had been postponed.  Neila Gear 08/05/2011, 10:52 AM

## 2011-08-05 NOTE — Progress Notes (Signed)
[facility] Group Notes:  (Counselor/Nursing/MHT/Case Management/Adjunct)  08/05/2011 4:00 PM  Type of Therapy:  Counseling Group  Participation Level:  Active  Participation Quality:  Appropriate  Affect:  Appropriate  Cognitive:  Appropriate  Insight:  Good  Engagement in Group:  Good  Engagement in Therapy:  Good  Modes of Intervention:  Clarification and Support  Summary of Progress/Problems: Pt. Participated in group session on self sabotaging behaviors and enabling. Each pt. Identified their self sabotaging behaviors and how to positively enable themselves to make the changes they need in their life. The pt. Spoke his self sabotaging behavior of doing things in order to go back to jail. The pt. spoke about how  He discussed with his counselor and found how he was self sabotaging himself. Pt. States he has not been in jail lately but states he had 23 years of jail and that often when he was successful  When came out of jail that he had a certain "fear" that he was doing well and that he is Neila Gear 08/05/2011, 4:00 PM

## 2011-08-05 NOTE — Progress Notes (Signed)
Patient ID: Derek Weaver, male   DOB: 12-12-1960, 51 y.o.   MRN: [id number] Nursing: Pt. tends to be isolative and spent most of the evening in bed, stating he felt tired.  Pt. Denies lethality at this time, though he admits to having cut himself before, each time precipitated by feelings of frustration and having to listen to drunken arguments and feeling helpless to get away from the noise.  Pt. 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  The exterminators had serviced the home he has been living in and he assisted them by moving furniture.   Pt. had come to med window before 21:00 and when told he would have to wait until at least 21:30.he went to his room and to bed.  Pt. Then had to be awakened to take his HS medication.  Pt. Spoke to this Clinical research associate about his need to cut and he agreed that he needed to find a better way to deal with his frustration and sadness.  Pt. Then laid down and went to sleep quickly. 06:00 Pt. Slept through the remainder of the night.

## 2011-08-05 NOTE — Progress Notes (Signed)
[facility] MD Progress Note  08/05/2011 1:30 PM  "feel content today. I was feeling really bad about coming back here. I was here not too long ago. when I was here the last time, I went to my group meetings, I learned coping skills. I thought I would make it all right in the real world, but I did not. I got home, was taking my medicines and going to AA meetings, but I didn't have a provider to follow-up with for my medicines. Stress from work and home got to me, I started using again.  I felt like I have let some people here down. They worked hard to make sure I was O. K once I leave here"  Diagnosis:   Axis I: Cannabis abuse, uncomplicated Axis II: Deferred Axis III:  Past Medical History  Diagnosis Date  . Depression   . Anxiety   . Personality disorder    Axis IV: No changes Axis V: 51-60 moderate symptoms  ADL's:  Intact  Sleep:  Yes,  AEB: 6.75  Appetite:  Yes,  AEB: "I eat well"  Suicidal Ideation:   Plan:  Yes, passively  Intent:  No  Means:  No  Homicidal Ideation:   Plan:  No  Intent:  No  Means:  No  AEB (as evidenced by):  Mental Status: General Appearance Derek Weaver:  Casual Eye Contact:  Good Motor Behavior:  Normal Speech:  Normal Level of Consciousness:  Alert Mood:  "I'm content" Affect:  Appropriate Anxiety Level:  Minimal Thought Process:  Coherent Thought Content:  WNL Perception:  Normal Judgment:  Fair Insight:  Present Cognition:  Orientation time, place and person Sleep:  Number of Hours: 6.75   Vital Signs:Blood pressure 122/68, pulse 89, temperature 97.9 F (36.6 C), temperature source Oral, resp. rate 16, height 5\' 10"  (1.778 m), weight 175 lb (79.379 kg), SpO2 97.00%.  Lab Results: No results found for this or any previous visit (from the past 48 hour(s)).  Treatment Plan Summary: Daily contact with patient to assess and evaluate symptoms and progress in treatment Medication management  Plan: Continue current treatment  plan.  [name] I 08/05/2011, 1:30 PM

## 2011-08-06 NOTE — Progress Notes (Signed)
[facility] MD Progress Note  08/06/2011 3:31 PM  Patient reports during rounds, "I feel pretty good"  Diagnosis:   Axis I: Major depressive disorder,            Cannabis abuse, uncomplicated Axis II: Borderline trait Axis III:  Past Medical History  Diagnosis Date  . Depression   . Anxiety   . Personality disorder    Axis IV: No changes Axis V: 61-70 mild symptoms  ADL's:  Intact  Sleep:  Yes,  AEB: 6.75  Appetite:  Yes,  AEB:  Suicidal Ideation: No  Plan:  No  Intent:  No  Means:  No  Homicidal Ideation:   Plan:  No  Intent:  No  Means:  No    Mental Status: General Appearance /Behavior:  Casual Eye Contact:  Good Motor Behavior:  Normal Speech:  Normal Level of Consciousness:  Alert Mood:  Euthymic Affect:  Appropriate Anxiety Level:  Minimal Thought Process:  Coherent Thought Content:  WNL Perception:  Normal Judgment:  Fair Insight:  Present Cognition:  Orientation time, place and person Sleep:  Number of Hours: 6.75   Vital Signs:Blood pressure 103/70, pulse 101, temperature 97 F (36.1 C), temperature source Oral, resp. rate 16, height 5\' 10"  (1.778 m), weight 175 lb (79.379 kg), SpO2 97.00%.  Lab Results: No results found for this or any previous visit (from the past 48 hour(s)).  Physical Findings: AIMS:  , ,  ,  ,    CIWA:    COWS:     Treatment Plan Summary: Daily contact with patient to assess and evaluate symptoms and progress in treatment Medication management  Plan: Continue current treatment plan.            [name] I 08/06/2011, 3:31 PM

## 2011-08-06 NOTE — Progress Notes (Signed)
[facility] Group Notes:  (Counselor/Nursing/MHT/Case Management/Adjunct)  08/06/2011 4:31 PM  Type of Therapy:  Counseling Group Therapy  Participation Level:  Active  Participation Quality:  Appropriate  Affect:  Appropriate  Cognitive:  Appropriate  Insight:  Good  Engagement in Group:  Good  Engagement in Therapy:  Good  Modes of Intervention:  Clarification and Support  Summary of Progress/Problems: Pt.  participated in group session on supports and how to find supports if they have none. The pt shared who their supports wee in their lives and identified unhealthy and healthy supports. Pt.'s were encouraged to have more than one support if their support persons were not able to be there for them. Pt. Spoke about his AA sponsor being a support to him. Pt. Discussed finding other counseling supports to help him in his recovery.  Neila Gear 08/06/2011, 4:31 PM

## 2011-08-06 NOTE — Progress Notes (Signed)
Patient ID: Derek Weaver, male   DOB: [birth year], 51 y.o.   MRN: [id number] Pt. Has been up in the DR tonight and interacting with peers appropriately: denies lethality and A/V/H's.  Pt. still looks sad at times, but is able to smile more often: Vegetative is better, mood  Is improving.

## 2011-08-06 NOTE — Progress Notes (Signed)
Patient ID: Derek Weaver, male   DOB: 1960-10-31, 51 y.o.   MRN: [id number] 3914/12/2011  Derek Weaver is up this AM. in the milieu. HE showered and attended his first group at 0900.He makes good eye contact with this Clinical research associate. He is pleasant and cooperative, filled out his self inventory sheet and on it he wrote  That he cont to have " off and on" SI, he rated his depression and hopelessness " 6 / 6 ", respectively,  and he stated his DC plan is  To " make my mental health appts and mtgs and need to find out about my evening classes". A HE is compliant with his medications and his therpaies. R Safety is maintained and POC contd with therapeutic relationship in place. PD RN [facility] `

## 2011-08-06 NOTE — Progress Notes (Signed)
Patient ID: Derek Weaver, male   DOB: [birth year], 51 y.o.   MRN: [id number] 08/06/2011 1200 D [name] is OOB UAL on the 500 hall today. HE makes good eye contact. HE is cooperative, pleasant and compliant to deal with . HE completed his self inventory sheet and handed it in...stating on it that he cont to have " off and on SI" but that he could contract for safety. HE rated his feelings of depression and hopelessness " 6 6 " but stated his DC plans included  " plan to make my mental health appts and meetings and I need to find out about my evening classes".  A He is medciated per MD order. R Safety is maintaiend and POC includes fostering therapeutic relationship PD RN [facility]

## 2011-08-07 MED ORDER — CITALOPRAM HYDROBROMIDE 20 MG PO TABS: 20.0000 mg | ORAL_TABLET | Freq: Every day | ORAL | Status: DC

## 2011-08-07 MED ORDER — TRAZODONE HCL 100 MG PO TABS: 100.0000 mg | ORAL_TABLET | Freq: Every day | ORAL | Status: DC

## 2011-08-07 MED ORDER — TOPIRAMATE 100 MG PO TABS: 100.0000 mg | ORAL_TABLET | Freq: Every day | ORAL | Status: DC

## 2011-08-07 MED ORDER — TOPIRAMATE 50 MG PO TABS: 50.0000 mg | ORAL_TABLET | Freq: Every evening | ORAL | Status: DC

## 2011-08-07 MED ORDER — RISPERIDONE 0.25 MG PO TABS: 0.2500 mg | ORAL_TABLET | Freq: Two times a day (BID) | ORAL | Status: DC

## 2011-08-07 MED ORDER — TOPIRAMATE 100 MG PO TABS: ORAL_TABLET | ORAL | Status: DC

## 2011-08-07 NOTE — Progress Notes (Signed)
[facility] Group Notes:  (Counselor/Nursing/MHT/Case Management/Adjunct)  08/07/2011 2:15 PM  Type of Therapy:  Group Therapy  Participation Level:  Minimal  Participation Quality:  Attentive  Affect:  Appropriate  Cognitive:  Appropriate  Insight:  Limited  Engagement in Group:  Limited  Engagement in Therapy:  Limited  Modes of Intervention:  Problem-solving, Support and exploration  Summary of Progress/Problems: Derek Weaver attended group therapy session on overcoming obstacles, pt was quiet yet attentive. Derek Weaver, LPCA    Derek Weaver 08/07/2011, 2:15 PM

## 2011-08-07 NOTE — Progress Notes (Signed)
[facility] Case Management Discharge Plan:  Will you be returning to the same living situation after discharge: Yes,  returning to recovery house Would you like a referral for services when you are discharged:Yes,  appointment scheduled at [facility] for follow up Do you have access to transportation at discharge:Yes,  pt provided a bus pass for transportation home Do you have the ability to pay for your medications:Yes,  discussed with pt staying on meds; [facility] to assist pt with med affordability  Interagency Information:   Release of information consent forms completed and in the chart; pt's signature needed at discharge.   Patient to Follow up at:  Follow-up Information    Follow up with [facility] on 08/11/2011. (Appointment scheduled at 10:30 am)    Contact information:   [address], [place] [address] [phone]      Call [facility]. (to make appointment )    Contact information:   [address] [place], [place] [address] [phone]         Patient denies SI/HI:   Yes,  denies today.    Safety Planning and Suicide Prevention discussed:  Yes,  discussed with pt.  Barrier to discharge identified: None  Summary and Recommendations: No recommendations from SW.  No further needs voiced by pt.  Pt stable to discharge.     Derek Weaver 08/07/2011, 11:04 AM

## 2011-08-07 NOTE — Discharge Summary (Signed)
  Patient ID: Derek Weaver MRN: [id number] DOB/AGE: 1960/09/07 51 y.o.  Admit date: 08/02/2011 Discharge date: 08/07/2011  Admission Diagnoses: Borderline behavior                                         Cannabis abuse, uncomplicated.   Hospital Course: Walked to [facility] from his group home. Had cut on his forearms and reported that he had overdosed on his prescribed meds. Once at ED he recanted the info about overdose but said he was suicidal. Is chronically so and when last here 9/30-1017/12 cut on himself while inpatient.Has been a cutter since released from prison in 1995.Since his discharge he has been at a halfway house and has had employment through a temp agency.An exterminator company was recently at the halfway house to get rid of bed bugs.  Patient while a patient here [facility], received medication management as well group therapy. He reports improved mood and decreased symptoms of suicidal ideation on daily basis. He is currently discharged to his home with 2 weeks supply of medications. He will be followed on an out patient basis for psychiatric care at [facility].   Discharge Diagnoses:  Principal Problem:  *Borderline behavior Active Problems:  Cannabis abuse, uncomplicated   Discharged Condition: Mental Status:  General Appearance [name]: Neat  Eye Contact: Good  Motor Behavior: Normal  Speech: Normal  Level of Consciousness: Alert  Mood: 1 on a scale of 1 is the least and 10 is the most  Affect: Appropriate  Anxiety Level: 4 on a scale of 1 is the least and 10 is the most  Thought Process: Coherent  Thought Content: WNL  Perception: Normal  Judgment: Good  Insight: Present  Cognition: Orientation time, place and person  Concentration Yes  Sleep: Number of Hours: 6.75  Vital Signs:Blood pressure 127/82, pulse 81, temperature 96.9 F (36.1 C), temperature source Oral, resp. rate 18, height 5\' 10"  (1.778 m), weight 79.379 kg (175 lb), SpO2  97.00%.     Current Discharge Medication List    CONTINUE these medications which have CHANGED   Details  citalopram (CELEXA) 20 MG tablet Take 1 tablet (20 mg total) by mouth daily. Qty: 30 tablet, Refills: 0    risperiDONE (RISPERDAL) 0.25 MG tablet Take 1 tablet (0.25 mg total) by mouth 2 (two) times daily. Qty: 60 tablet, Refills: 0    !! topiramate (TOPAMAX) 100 MG tablet Take 1 tablet (100 mg total) by mouth daily. Qty: 30 tablet, Refills: 0    !! topiramate (TOPAMAX) 50 MG tablet Take 1 tablet (50 mg total) by mouth every evening. Qty: 30 tablet, Refills: 0    traZODone (DESYREL) 100 MG tablet Take 1 tablet (100 mg total) by mouth at bedtime. Qty: 30 tablet, Refills: 0     !! - Potential duplicate medications found. Please discuss with provider.     Follow-up Information    Follow up with [facility] on 08/11/2011. (Appointment scheduled at 10:30 am)    Contact information:   [address], [place] [address] [phone]      Call [facility]. (to make appointment )    Contact information:   [address] [place], [place] [address] [phone]         Signed: Sanjuana Kava 08/07/2011, 12:55 PM

## 2011-08-07 NOTE — Discharge Summary (Signed)
AXIS I Substance Abuse and Substance Induced Mood Disorder  AXIS II Borderline Personality Dis.  AXIS III Past Medical History  Diagnosis Date  . Depression   . Anxiety   . Personality disorder       AXIS IV economic problems and other psychosocial or environmental problems  AXIS V 51-60 moderate symptoms

## 2011-08-07 NOTE — Progress Notes (Signed)
Patient ID: Derek Weaver, male   DOB: 1960/08/02, 51 y.o.   MRN: [id number] Nursing: Pt. Denies lethality and A/V/H's; continues to express feelings of helplessness for how he came to viciously cut himself when he was sober.  He also still maintains that the cutting released his frustrations and believes that without medication he is apt to do it all again at any time.  Pt. Has limited to no insight into his actions and reactions: he says he felt more obligated to give all of his daily pay to the landlord rather than to save some of his pay for his medications.  (His landlord) "he's a nice guy and he never pushes.  I owed him about $600.00 because I was unemployed for so long." Encouraged Pt. to think of his care first and to try plan how he will use his funds.  He said he would, but also expressed doubt he could stick to a plan.

## 2011-08-07 NOTE — Discharge Summary (Signed)
Discharge Note  Patient:  Derek Weaver is an 51 y.o., male DOB:  [birth year]  Date of Admission:  08/02/2011  Date of Discharge:  08/07/2011  Level of Care:  OP  Discharge destination:  HOME  Is patient on multiple antipsychotic therapies at discharge:  NO  Patient phone:  [phone] (home) Patient address:   [address] [place] [address]  The patient received suicide prevention pamphlet:  YES Belongings returned:  Valuables  Willy Pinkerton 08/07/2011,11:47 AM

## 2011-08-07 NOTE — Progress Notes (Signed)
Recreation Therapy Group Note  Date: 08/07/2011         Time: 1145       Group Topic/Focus: Patient invited to participate in animal assisted therapy. Pets as a coping skill and responsibility were discussed.   Participation Level: Active  Participation Quality: Appropriate and Attentive  Affect: Appropriate  Cognitive: Appropriate and Oriented   Additional Comments: None  Maelani Yarbro 08/07/2011 12:44 PM 

## 2011-08-07 NOTE — Progress Notes (Signed)
Pt attended discharge planning group and actively participated.  Pt presents with bright affect and mood.  Pt reports feeling stable to d/c today.  Pt ranks depression and anxiety at a 4 today.  Pt reports SI but states he always has SI and contracts for safety, knowing what to do to stay safe. Pt will follow up at [facility] for medication management and therapy and [facility] for support groups.  SW will provide pt with a bus pass for transportation home.  Pt discussed how he stopped taking his meds because he felt he was doing better and now knows not to do this in the future.  Pt states that he was more concerned about paying rent and didn't ask for help.  Pt states he plans to use his supports and stay on meds.  No further needs at this time.  Pt stable to d/c.  Reyes Ivan, LCSWA 08/07/2011  11:02 AM

## 2011-08-07 NOTE — Tx Team (Signed)
Interdisciplinary Treatment Plan Update (Adult)  Date:  08/07/2011  Time Reviewed:  10:16 AM   Progress in Treatment: Attending groups: Yes Participating in groups:  Yes Taking medication as prescribed: Yes Tolerating medication:  Yes Family/Significant othe contact made:  Yes Patient understands diagnosis:  Yes Discussing patient identified problems/goals with staff:  Yes Medical problems stabilized or resolved:  Yes Denies suicidal/homicidal ideation: Yes Issues/concerns per patient self-inventory:  None identified Other: N/A  New problem(s) identified: None Identified  Reason for Continuation of Hospitalization: Stable to d/c  Interventions implemented related to continuation of hospitalization: Stable to d/c  Additional comments: N/A  Estimated length of stay: D/C today  Discharge Plan: Pt will follow up at [facility] for medication management and therapy and support groups, AA meetings  New goal(s): N/A  Review of initial/current patient goals per problem list:    1.  Goal(s): Reduce depressive symptoms  Met:  No  Target date: by discharge  As evidenced by: Reducing depression from a 10 to a 3 as reported by pt.  Pt ranks at a 4 today.  2.  Goal (s): Reduce/Eliminate suicidal ideation  Met:  Yes  Target date: by discharge  As evidenced by: pt reporting no SI.  Pt denies SI.    3.  Goal(s): Reduce anxiety  Met:  No  Target date: by discharge  As evidenced by: Reduce anxiety from a 10 to a 3 as reported by pt.  Pt ranks at a 4 today.    Attendees: Patient:  Derek Weaver 08/07/2011 11:07 AM   Family:     Physician:  Orson Aloe, MD  08/07/2011  10:16 AM   Nursing:   Neill Loft, RN 08/07/2011 10:21 AM   Case Manager:  Reyes Ivan, LCSWA 08/07/2011  10:16 AM   Counselor:  Angus Palms, LCSW 08/07/2011  10:16 AM   Other:  Juline Patch, LCSW 08/07/2011  10:16 AM   Other:  [name], NP 08/07/2011 10:22 AM   Other:     Other:      Scribe for Treatment  Team:   Carmina Miller, 08/07/2011 , 10:16 AM

## 2011-08-07 NOTE — Progress Notes (Signed)
Suicide Risk Assessment  Discharge Assessment     Demographic factors:  Assessment Details Time of Assessment: Admission Information Obtained From: Patient Current Mental Status:  Current Mental Status: Suicidal ideation indicated by patient;Suicide plan;Self-harm thoughts;Self-harm behaviors;Belief that plan would result in death Risk Reduction Factors:  Risk Reduction Factors: Positive therapeutic relationship  CLINICAL FACTORS:   Severe Anxiety and/or Agitation Depression:   Comorbid alcohol abuse/dependence Hopelessness Impulsivity Alcohol/Substance Abuse/Dependencies Personality Disorders:   Cluster B Previous Psychiatric Diagnoses and Treatments Medical Diagnoses and Treatments/Surgeries  COGNITIVE FEATURES THAT CONTRIBUTE TO RISK:  Thought constriction (tunnel vision)    SUICIDE RISK:   Minimal: No identifiable suicidal ideation.  Patients presenting with no risk factors but with morbid ruminations; may be classified as minimal risk based on the severity of the depressive symptoms   Suicidal Ideation:   Plan:  No  Intent:  No  Means:  No  Homicidal Ideation:   Plan:  No  Intent:  No  Means:  No  Mental Status: General Appearance /Behavior:  Neat Eye Contact:  Good Motor Behavior:  Normal Speech:  Normal Level of Consciousness:  Alert Mood:  1 on a scale of 1 is the least and 10 is the most Affect:  Appropriate Anxiety Level:  4 on a scale of 1 is the least and 10 is the most Thought Process:  Coherent Thought Content:  WNL Perception:  Normal Judgment:  Good Insight:  Present Cognition:  Orientation time, place and person Concentration Yes Sleep:  Number of Hours: 6.75   Vital Signs:Blood pressure 127/82, pulse 81, temperature 96.9 F (36.1 C), temperature source Oral, resp. rate 18, height 5\' 10"  (1.778 m), weight 79.379 kg (175 lb), SpO2 97.00%. Lab Results:No results found for this or any previous visit (from the past 48 hour(s)).   Risk of  self harm is chronically elevated due to his borderline tendencies of self mutilation when he gets stressed.   He learned from this hospitalization that he can't miss even a day of his medications and has learned in the past that he can't go 2 days without using his yoga.    Risk of harm to others is minimal in that his first and only thought of any harm to anything is to himself.  Arriyana Rodell 08/07/2011, 11:10 AM

## 2011-08-08 NOTE — Progress Notes (Signed)
Patient Discharge Instructions:  Admission Note Faxed,  08/08/2011 Discharge Note Faxed,   08/08/2011 After Visit Summary Faxed,  08/08/2011 Faxed to the [facility] Care provider:  08/08/2011 D/C Summary faxed 08/08/2011 Facesheet faxed 08/08/2011   Faxed to [facility] [phone]  Wandra Scot, 08/08/2011, 2:19 PM

## 2011-08-27 ENCOUNTER — Emergency Department (HOSPITAL_COMMUNITY)
Admission: EM | Admit: 2011-08-27 | Discharge: 2011-08-27 | Disposition: A | Discharge status: Other Acute Inpatient Hospital | Payer: Self-pay | Attending: Emergency Medicine | Admitting: Emergency Medicine

## 2011-08-27 ENCOUNTER — Emergency Department (HOSPITAL_COMMUNITY): Payer: Self-pay

## 2011-08-27 ENCOUNTER — Encounter (HOSPITAL_COMMUNITY): Payer: Self-pay | Admitting: *Deleted

## 2011-08-27 DIAGNOSIS — N200 Calculus of kidney: Secondary | ICD-10-CM

## 2011-08-27 DIAGNOSIS — R109 Unspecified abdominal pain: Secondary | ICD-10-CM | POA: Insufficient documentation

## 2011-08-27 DIAGNOSIS — R10811 Right upper quadrant abdominal tenderness: Secondary | ICD-10-CM | POA: Insufficient documentation

## 2011-08-27 DIAGNOSIS — F17200 Nicotine dependence, unspecified, uncomplicated: Secondary | ICD-10-CM | POA: Insufficient documentation

## 2011-08-27 DIAGNOSIS — N201 Calculus of ureter: Secondary | ICD-10-CM | POA: Insufficient documentation

## 2011-08-27 LAB — CBC
HCT: 43.5 % (ref 39.0–52.0)
Platelets: 209 10*3/uL (ref 150–400)
RBC: 4.76 MIL/uL (ref 4.22–5.81)
RDW: 12.8 % (ref 11.5–15.5)
WBC: 15.6 10*3/uL — ABNORMAL HIGH (ref 4.0–10.5)

## 2011-08-27 LAB — DIFFERENTIAL
Basophils Absolute: 0 10*3/uL (ref 0.0–0.1)
Lymphocytes Relative: 7 % — ABNORMAL LOW (ref 12–46)
Neutro Abs: 13.6 10*3/uL — ABNORMAL HIGH (ref 1.7–7.7)

## 2011-08-27 LAB — URINALYSIS, ROUTINE W REFLEX MICROSCOPIC
Leukocytes, UA: NEGATIVE
Nitrite: NEGATIVE
Specific Gravity, Urine: 1.025 (ref 1.005–1.030)
pH: 7 (ref 5.0–8.0)

## 2011-08-27 LAB — COMPREHENSIVE METABOLIC PANEL
ALT: 14 U/L (ref 0–53)
AST: 17 U/L (ref 0–37)
Albumin: 4.1 g/dL (ref 3.5–5.2)
Alkaline Phosphatase: 63 U/L (ref 39–117)
Chloride: 105 mEq/L (ref 96–112)
Potassium: 4.4 mEq/L (ref 3.5–5.1)
Sodium: 139 mEq/L (ref 135–145)
Total Bilirubin: 0.3 mg/dL (ref 0.3–1.2)

## 2011-08-27 LAB — URINE MICROSCOPIC-ADD ON

## 2011-08-27 MED ORDER — ONDANSETRON HCL 4 MG/2ML IJ SOLN
4.0000 mg | Freq: Once | INTRAMUSCULAR | Status: AC
  Administered 2011-08-27: 4 mg via INTRAVENOUS
  Filled 2011-08-27: qty 2

## 2011-08-27 MED ORDER — OXYCODONE-ACETAMINOPHEN 5-325 MG PO TABS: ORAL_TABLET | ORAL | Status: AC

## 2011-08-27 MED ORDER — KETOROLAC TROMETHAMINE 30 MG/ML IJ SOLN
30.0000 mg | Freq: Once | INTRAMUSCULAR | Status: AC
  Administered 2011-08-27: 30 mg via INTRAVENOUS
  Filled 2011-08-27: qty 1

## 2011-08-27 MED ORDER — PROMETHAZINE HCL 25 MG PO TABS: 25.0000 mg | ORAL_TABLET | Freq: Four times a day (QID) | ORAL | Status: AC | PRN

## 2011-08-27 MED ORDER — HYDROMORPHONE HCL PF 1 MG/ML IJ SOLN
1.0000 mg | Freq: Once | INTRAMUSCULAR | Status: AC
  Administered 2011-08-27: 1 mg via INTRAVENOUS
  Filled 2011-08-27: qty 1

## 2011-08-27 MED ORDER — IBUPROFEN 800 MG PO TABS: 800.0000 mg | ORAL_TABLET | Freq: Three times a day (TID) | ORAL | Status: AC

## 2011-08-27 NOTE — ED Notes (Signed)
Urine strainer provided to pt.

## 2011-08-27 NOTE — ED Provider Notes (Signed)
History     CSN: [id number]  Arrival date & time 08/27/11  1401   First MD Initiated Contact with Patient 08/27/11 1455      Chief Complaint  Patient presents with  . Flank Pain    Right  . Nausea  . Emesis    (Consider location/radiation/quality/duration/timing/severity/associated sxs/prior treatment) HPI  Patient who is currently at [facility] for suicidal ideation with plan, is escorted from [place] in police custody with complaint of acute onset right upper flank pain with radiation towards right lower abdomen that started this morning waking him from sleep. Patient states he's felt nauseous and vomited once. He denies dysuria or hematuria but states "it is hard to get my urine to come out." Patient denies any history of kidney stones, gallbladder disease, or pancreatitis. States the symptoms are acute onset, waxing and waning, denying aggravating or alleviating factors. Patient was not given anything for pain prior to arrival. He denies fevers or chills.  Past Medical History  Diagnosis Date  . Depression   . Anxiety   . Personality disorder     History reviewed. No pertinent past surgical history.  History reviewed. No pertinent family history.  History  Substance Use Topics  . Smoking status: Current Everyday Smoker -- 0.5 packs/day for 26 years    Types: Cigarettes  . Smokeless tobacco: Never Used  . Alcohol Use: No      Review of Systems  All other systems reviewed and are negative.    Allergies  Review of patient's allergies indicates no known allergies.  Home Medications   Current Outpatient Rx  Name Route Sig Dispense Refill  . CITALOPRAM HYDROBROMIDE 20 MG PO TABS Oral Take 1 tablet (20 mg total) by mouth daily. 30 tablet 0  . RISPERIDONE 0.25 MG PO TABS Oral Take 1 tablet (0.25 mg total) by mouth 2 (two) times daily. 60 tablet 0  . TOPIRAMATE 100 MG PO TABS  Take 1 tablet every morning and 1/2 tablet every evening 45 tablet 0  . TRAZODONE HCL 100  MG PO TABS Oral Take 1 tablet (100 mg total) by mouth at bedtime. 30 tablet 0    BP 120/68  Pulse 51  Temp(Src) 97.4 F (36.3 C) (Oral)  Resp 16  Ht 5\' 10"  (1.778 m)  Wt 170 lb (77.111 kg)  BMI 24.39 kg/m2  SpO2 100%  Physical Exam  Nursing note and vitals reviewed. Constitutional: He is oriented to person, place, and time. He appears well-developed and well-nourished. No distress.  HENT:  Head: Normocephalic and atraumatic.  Eyes: Conjunctivae are normal.  Neck: Normal range of motion. Neck supple.  Cardiovascular: Normal rate, regular rhythm, normal heart sounds and intact distal pulses.  Exam reveals no gallop and no friction rub.   No murmur heard. Pulmonary/Chest: Effort normal and breath sounds normal. No respiratory distress. He has no wheezes. He has no rales. He exhibits no tenderness.  Abdominal: Soft. Bowel sounds are normal. He exhibits no distension and no mass. There is tenderness in the right upper quadrant. There is CVA tenderness. There is no rebound, no guarding and negative Murphy's sign.       TTP of right upper lateral abdomen. Negative murphey's sign  Musculoskeletal: Normal range of motion. He exhibits no edema and no tenderness.  Neurological: He is alert and oriented to person, place, and time.  Skin: Skin is warm and dry. No rash noted. He is not diaphoretic. No erythema.  Psychiatric: He has a normal mood and affect.  ED Course  Procedures (including critical care time)  IV dilaudid and zofran  Patient states pain has improved after IV dilaudid.  Labs Reviewed  CBC - Abnormal; Notable for the following:    WBC 15.6 (*)    All other components within normal limits  DIFFERENTIAL - Abnormal; Notable for the following:    Neutrophils Relative 88 (*)    Neutro Abs 13.6 (*)    Lymphocytes Relative 7 (*)    All other components within normal limits  URINALYSIS, ROUTINE W REFLEX MICROSCOPIC - Abnormal; Notable for the following:    APPearance  CLOUDY (*)    Hgb urine dipstick LARGE (*)    Protein, ur 30 (*)    All other components within normal limits  COMPREHENSIVE METABOLIC PANEL - Abnormal; Notable for the following:    Glucose, Bld 118 (*)    GFR calc non Af Amer 74 (*)    GFR calc Af Amer 86 (*)    All other components within normal limits  LIPASE, BLOOD  URINE MICROSCOPIC-ADD ON   Ct Abdomen Pelvis Wo Contrast  08/27/2011  *RADIOLOGY REPORT*  Clinical Data: Flank pain.  Nausea and emesis.  CT ABDOMEN AND PELVIS WITHOUT CONTRAST  Technique:  Multidetector CT imaging of the abdomen and pelvis was performed following the standard protocol without intravenous contrast.  Comparison: None  Findings: Lung bases are clear.  No pericardial or pleural effusion identified.  The there is no focal liver or bowel in the identified.  The spleen appears within normal limits.  The adrenal glands are normal.  The gallbladder is negative.   No biliary dilatation.  The pancreas is unremarkable.  The normal appearance of the left kidney.  There is mild right hydronephrosis and hydroureter.  Multiple tiny stones are identified within the distal ureter.  Within the urinary bladder near the right UVJ there is a stone which measures 4.1 mm, image 75.  No enlarged upper abdominal lymph nodes.  There is no pelvic or inguinal adenopathy.  The stomach and small bowel loops are unremarkable.  The appendix is identified and appears normal.  The colon is negative.  IMPRESSION:  1.  Right-sided hydronephrosis secondary to multiple tiny distal right ureteral calculi and the small stone noted within the urinary bladder near the right UVJ.  Original Report Authenticated By: Rosealee Albee, M.D.     1. Kidney stones       MDM  Kidney stones without signs or symptoms of UTI. Vital signs stable. Afebrile. Nontoxic-appearing. Pain well controlled in ER. Patient agreeable to followup plan with urology.        Jenness Corner, [place] 08/27/11 [phone]

## 2011-08-27 NOTE — ED Notes (Signed)
Pt from [place] escorted by [employer] c/o right flank pain that radiates to right side, started this morning upon awakening, pt also endorses N/V and dysuria, reports " it's hard getting it to come out".

## 2011-08-27 NOTE — ED Notes (Signed)
C/o right flank pain that radiates to the front, onset this morning, 7/10, pounding, with difficulty urinating, denied dysuria. Dark urine. Positive CVA tenderness on the right side. Denied hx of kidney stone. Pt from [employer], has a Transport planner at bedside. Was at [facility] for SI.

## 2011-08-28 NOTE — ED Provider Notes (Signed)
Medical screening examination/treatment/procedure(s) were performed by non-physician practitioner and as supervising physician I was immediately available for consultation/collaboration.  Cyndra Numbers, MD 08/28/11 1247

## 2011-09-07 ENCOUNTER — Emergency Department (HOSPITAL_COMMUNITY)
Admission: EM | Admit: 2011-09-07 | Discharge: 2011-09-09 | Disposition: A | Discharge status: Home / Self Care | Payer: Self-pay | Attending: Emergency Medicine | Admitting: Emergency Medicine

## 2011-09-07 DIAGNOSIS — R45851 Suicidal ideations: Secondary | ICD-10-CM

## 2011-09-07 DIAGNOSIS — F329 Major depressive disorder, single episode, unspecified: Secondary | ICD-10-CM | POA: Insufficient documentation

## 2011-09-07 DIAGNOSIS — F411 Generalized anxiety disorder: Secondary | ICD-10-CM | POA: Insufficient documentation

## 2011-09-07 DIAGNOSIS — F3289 Other specified depressive episodes: Secondary | ICD-10-CM | POA: Insufficient documentation

## 2011-09-07 DIAGNOSIS — F17200 Nicotine dependence, unspecified, uncomplicated: Secondary | ICD-10-CM | POA: Insufficient documentation

## 2011-09-07 LAB — DIFFERENTIAL
Lymphocytes Relative: 33 % (ref 12–46)
Monocytes Absolute: 0.6 10*3/uL (ref 0.1–1.0)
Monocytes Relative: 8 % (ref 3–12)
Neutro Abs: 3.7 10*3/uL (ref 1.7–7.7)

## 2011-09-07 LAB — POCT I-STAT, CHEM 8
Calcium, Ion: 1.11 mmol/L — ABNORMAL LOW (ref 1.12–1.32)
HCT: 38 % — ABNORMAL LOW (ref 39.0–52.0)
Hemoglobin: 12.9 g/dL — ABNORMAL LOW (ref 13.0–17.0)
TCO2: 23 mmol/L (ref 0–100)

## 2011-09-07 LAB — CBC
HCT: 37.3 % — ABNORMAL LOW (ref 39.0–52.0)
Hemoglobin: 12.7 g/dL — ABNORMAL LOW (ref 13.0–17.0)
MCHC: 34 g/dL (ref 30.0–36.0)
MCV: 93.7 fL (ref 78.0–100.0)
WBC: 6.8 10*3/uL (ref 4.0–10.5)

## 2011-09-07 NOTE — ED Notes (Signed)
Report given from [place]. F.  RN to Leanord Hawking RN.

## 2011-09-07 NOTE — ED Notes (Signed)
Pt st's he has been feeling suicidal for 2 days.  St's he is a cutter and wants to cut himself. Was recently discharged from [facility].  Also c/o taking approx 10 Risperidol .25mg  through out the day today.

## 2011-09-08 ENCOUNTER — Encounter (HOSPITAL_COMMUNITY): Payer: Self-pay | Admitting: Emergency Medicine

## 2011-09-08 LAB — RAPID URINE DRUG SCREEN, HOSP PERFORMED
Amphetamines: NOT DETECTED
Barbiturates: NOT DETECTED
Benzodiazepines: NOT DETECTED
Cocaine: NOT DETECTED
Opiates: NOT DETECTED
Tetrahydrocannabinol: POSITIVE — AB

## 2011-09-08 LAB — ETHANOL: Alcohol, Ethyl (B): 98 mg/dL — ABNORMAL HIGH (ref 0–11)

## 2011-09-08 NOTE — ED Notes (Signed)
Patient sleeping with NAD. Sitter at bedside.  

## 2011-09-08 NOTE — ED Notes (Signed)
Patient resting and watching TV. Sitter at bedside.

## 2011-09-08 NOTE — ED Notes (Signed)
Patient resting and watching TV with NAD. Sitter at bedside. 
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
## 2011-09-08 NOTE — ED Notes (Signed)
Patient sleeping with NAD. Patient wake when his name is called. Sitter is at bedside.

## 2011-09-08 NOTE — ED Notes (Signed)
Sitter has patients glasses in case he need them to see.

## 2011-09-08 NOTE — ED Notes (Signed)
First meeting with patient. Patient resting in bed watching TV with NAD. Sitter at bedside.

## 2011-09-08 NOTE — ED Provider Notes (Signed)
History     CSN: [id number]  Arrival date & time 09/07/11  2204   First MD Initiated Contact with Patient 09/08/11 0029      Chief Complaint  Patient presents with  . Suicidal    (Consider location/radiation/quality/duration/timing/severity/associated sxs/prior treatment) HPI Comments: 51 year old male with a history of self cutting an injury his behavior, depression and some hallucinations present because of increased suicidal thoughts and tendency towards cutting. Symptoms have been getting worse for the last week and a half. He was recently hospitalized for similar symptoms but states it did not make much improvement. Symptoms are gradually getting worse, they're associated with hallucinations that are telling him to hurt himself. States he has a plan of going out of the woods and taking an overdose of sleeping pills but no we will find him. Symptoms are severe this time, he has no physical complaints  The history is provided by the patient and medical records.    Past Medical History  Diagnosis Date  . Depression   . Anxiety   . Personality disorder     No past surgical history on file.  No family history on file.  History  Substance Use Topics  . Smoking status: Current Everyday Smoker -- 0.5 packs/day for 26 years    Types: Cigarettes  . Smokeless tobacco: Never Used  . Alcohol Use: No      Review of Systems  All other systems reviewed and are negative.    Allergies  Review of patient's allergies indicates no known allergies.  Home Medications   Current Outpatient Rx  Name Route Sig Dispense Refill  . CITALOPRAM HYDROBROMIDE 20 MG PO TABS Oral Take 20 mg by mouth daily.    Marland Kitchen RISPERIDONE 0.25 MG PO TABS Oral Take 0.25 mg by mouth 2 (two) times daily.    . TOPIRAMATE 100 MG PO TABS Oral Take 50-100 mg by mouth 2 (two) times daily. Take 1 tablet every morning and 1/2 tablet every evening    . TRAZODONE HCL 100 MG PO TABS Oral Take 100 mg by mouth at bedtime  as needed. For sleep      BP 97/61  Pulse 83  Temp(Src) 97.2 F (36.2 C) (Oral)  Resp 15  SpO2 97%  Physical Exam  Nursing note and vitals reviewed. Constitutional: He appears well-developed and well-nourished. No distress.  HENT:  Head: Normocephalic and atraumatic.  Mouth/Throat: Oropharynx is clear and moist. No oropharyngeal exudate.  Eyes: Conjunctivae and EOM are normal. Pupils are equal, round, and reactive to light. Right eye exhibits no discharge. Left eye exhibits no discharge. No scleral icterus.  Neck: Normal range of motion. Neck supple. No JVD present. No thyromegaly present.  Cardiovascular: Normal rate, regular rhythm, normal heart sounds and intact distal pulses.  Exam reveals no gallop and no friction rub.   No murmur heard. Pulmonary/Chest: Effort normal and breath sounds normal. No respiratory distress. He has no wheezes. He has no rales.  Abdominal: Soft. Bowel sounds are normal. He exhibits no distension and no mass. There is no tenderness.  Musculoskeletal: Normal range of motion. He exhibits no edema and no tenderness.  Lymphadenopathy:    He has no cervical adenopathy.  Neurological: He is alert. Coordination normal.  Skin: Skin is warm and dry. No rash noted. No erythema.  Psychiatric:       Patient endorses hallucinations and suicidal thoughts. Admits to depression    ED Course  Procedures (including critical care time)  Labs Reviewed  CBC -  Abnormal; Notable for the following:    RBC 3.98 (*)    Hemoglobin 12.7 (*)    HCT 37.3 (*)    All other components within normal limits  ETHANOL - Abnormal; Notable for the following:    Alcohol, Ethyl (B) 98 (*)    All other components within normal limits  POCT I-STAT, CHEM 8 - Abnormal; Notable for the following:    Sodium 147 (*)    Calcium, Ion 1.11 (*)    Hemoglobin 12.9 (*)    HCT 38.0 (*)    All other components within normal limits  DIFFERENTIAL  URINE RAPID DRUG SCREEN (HOSP PERFORMED)   No  results found.   No diagnosis found.    MDM  Worsening depressive and suicidal behavior. Admits to taking 12-13 tablets of his restaurant today because of increased hallucinations and suicidal thoughts. At this time he is alert and appropriate and requesting help. We'll involve behavioral health for further evaluation and possible admission to   Labs without acute findings - pt has remained alert and non toxic - Behavioural health - Berna Spare is aware and attempting placement to [facility] at this time.    Change of shift - 7 AM - care signed out to Dr. Preston Fleeting.  Vida Roller, MD 09/08/11 [phone]

## 2011-09-08 NOTE — ED Notes (Signed)
Dinner requested- Regular diet- No sharps 

## 2011-09-08 NOTE — ED Notes (Signed)
Sitter at bedside.

## 2011-09-08 NOTE — ED Notes (Signed)
Patient resting while watching TV. Sitter at bedside. 

## 2011-09-08 NOTE — ED Notes (Signed)
Patient fed per permission from Dr. Hyacinth Meeker.

## 2011-09-08 NOTE — ED Notes (Addendum)
Patient sleeping with NAD at this time. Sitter at bedside. 

## 2011-09-08 NOTE — ED Notes (Signed)
Patient resting quietly. Sitter at bedside.

## 2011-09-08 NOTE — ED Notes (Signed)
Patient sleeping with NAD at this time. Sitter at bedside. 

## 2011-09-08 NOTE — ED Notes (Signed)
ACT team member at bedside doing a mental health assessment.

## 2011-09-08 NOTE — ED Notes (Signed)
Patient resting and watching TV with NAD at this time. Sitter at bedside.

## 2011-09-09 MED ORDER — TOPIRAMATE 50 MG PO TABS: 50.0000 mg | ORAL_TABLET | Freq: Two times a day (BID) | ORAL | Status: DC

## 2011-09-09 NOTE — ED Provider Notes (Addendum)
7:10 AM patient resting comfortably eating breakfast continues to feel suicidal states he's stressed over his living situation and wants to harm himself.Telepschiatry consult ordered pending placement.  Doug Sou, MD 09/09/11 [phone]  9:45 AM patient has had consult fromTelepsychiatry who has been the patient is not suicidal or homicidal. On requestioning the patient he currently has thoughts of suicide but feels confident he would not act out on him and feels that he would be safe treated as an outpatient.  Doug Sou, MD 09/09/11 [id number]
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
## 2011-09-09 NOTE — ED Notes (Signed)
Pt belongings returned to patient.  

## 2011-09-09 NOTE — ED Notes (Signed)
Social worker has spoke with pt and given multiple resources and bus passes. EDP informed. Will discharge pt.

## 2011-09-09 NOTE — Progress Notes (Signed)
Weekend MSW Note:  MSW received call from [employer] stating pt has been cleared via Telepsych for discharge.  Per request of telepsych, MSW to assess for alternative housing/half ways houses.  MSW reviewed chart notes and met with pt in his prvt room to address referral. Pt presented as A/O x4 and stated after his discharge from [facility], he was transitioned to "[facility]". Pt reports he is dissatisfied with the placement and has informed the owner he will not be returning therefore his personal items will be stored safely. Pt reports he is connected with [facility] and has started his Medicaid application as well as his SSD applications. 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 MSW provided pt a list of shelters/halfway houses in both [address] and [place]. MSW along with pt called  [facility] @ [phone] and confirmed pt is able to report directly for intake appt over the weekend 24/7.  MSW provided pt with x4 buss passes, a list of access to food/meals over the weekend as well as additional resources for anticipatory needs based on AX.  Pt reports he has an appt. with [facility] on Monday 09/11/2011 and an appt with [facility] for Medication review/therapy on 09/12/2011.  Pt denied any further MSW interventions/needs and reports to feel comfortable and ready for discharge.  RN updated on above.   Dionne Milo MSW [facility] Emergency Dept. Weekend/Social Worker [phone]
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
## 2011-09-09 NOTE — ED Notes (Signed)
Pt sitting in room 28 for telepsych

## 2011-09-17 IMAGING — CT CT HEAD WITHOUT CONTRAST
2 series · 16 of 30 positions shown, 20 images · non-contrast
Comparison: none

REASON FOR EXAM: ams
COMMENTS:

PROCEDURE:     CT  - CT HEAD WITHOUT CONTRAST  - February 01, 2011  [DATE]
RESULT:     Technique: Helical 5mm sections were obtained from the skull
base to the vertex without administration of intravenous contrast.

[Series 2: without · axial · non-contrast · 0.43mm/px · z∈[-596,-462]mm · 13 of 33 slices shown, 17 images]
[im 3/33  brain]
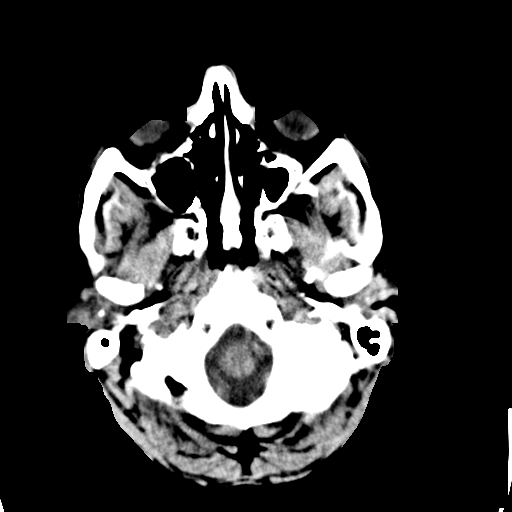
[im 3/33  bone]
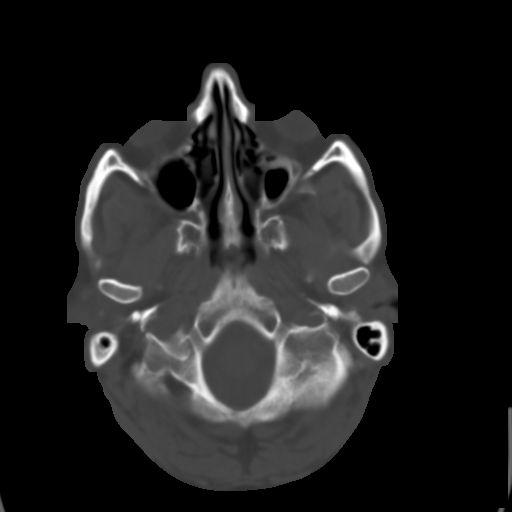
[im 5/33  brain]
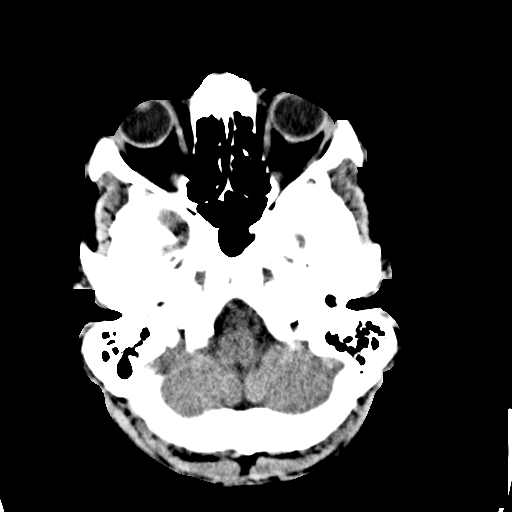
[im 7/33  brain]
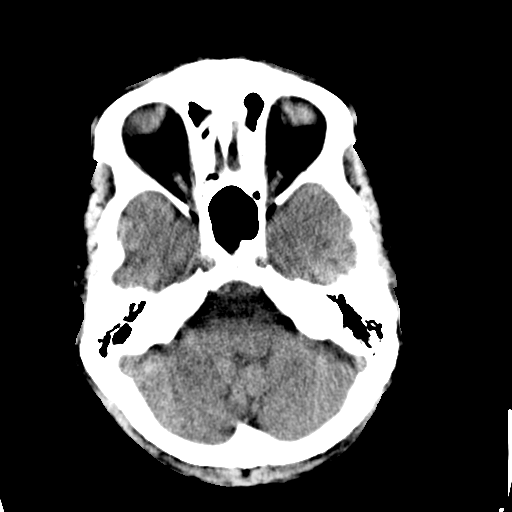
[im 10/33  brain]
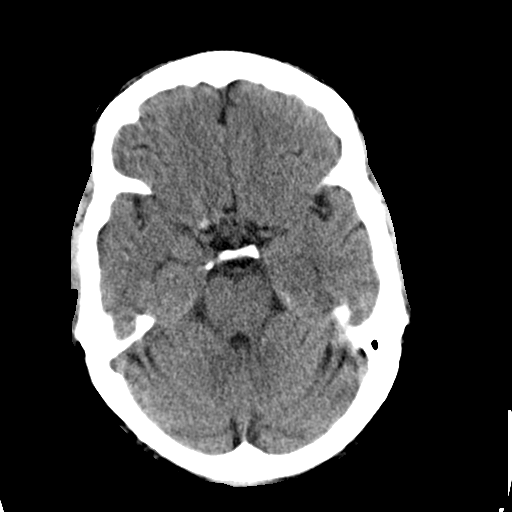
[im 12/33  brain]
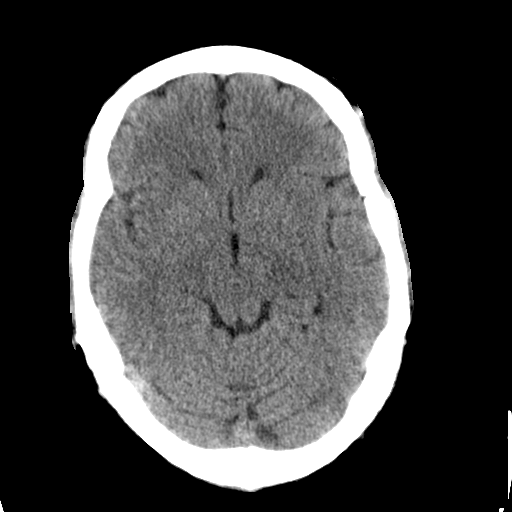
[im 12/33  bone]
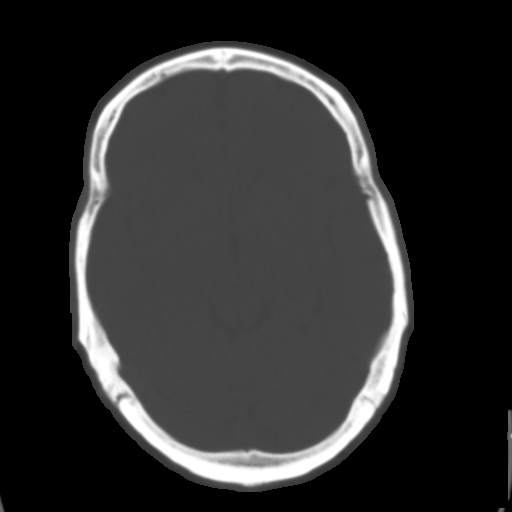
[im 14/33  brain]
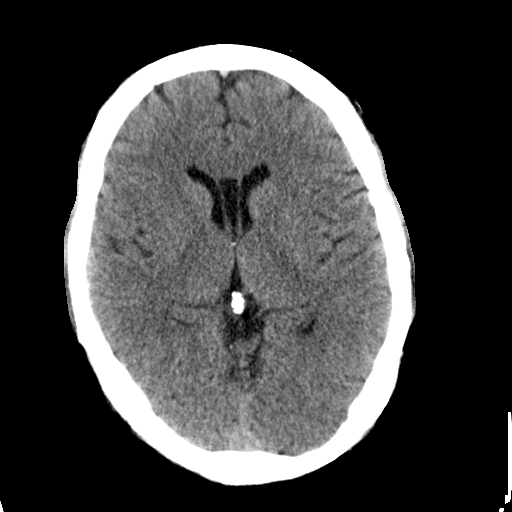
[im 17/33  brain]
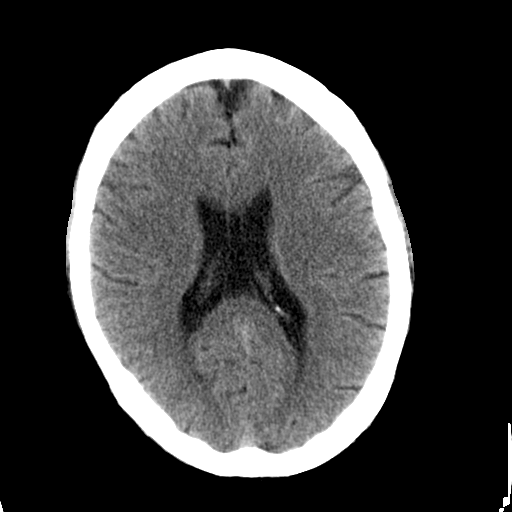
[im 19/33  brain]
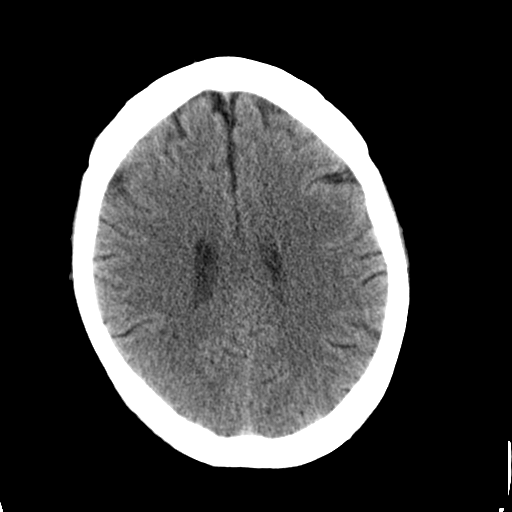
[im 21/33  brain]
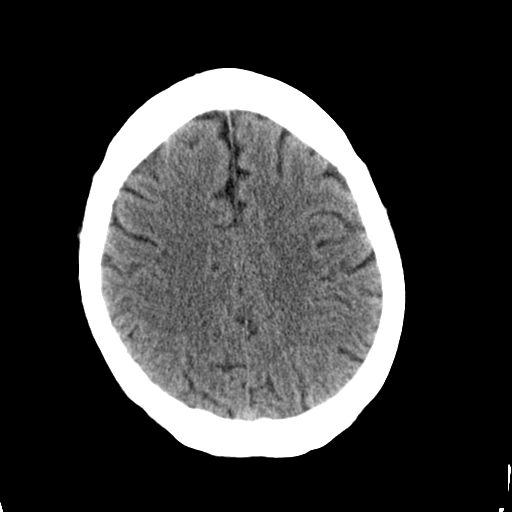
[im 21/33  bone]
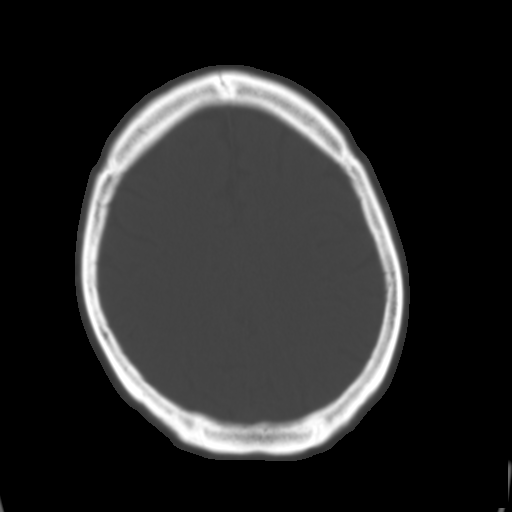
[im 23/33  brain]
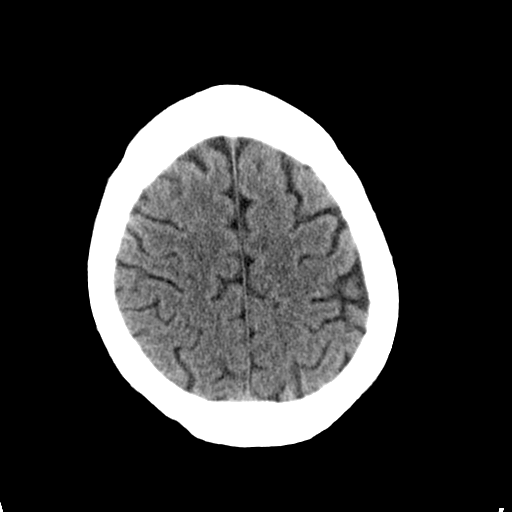
[im 26/33  brain]
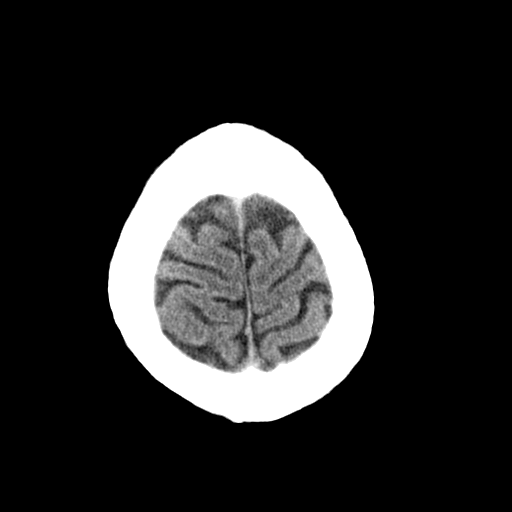
[im 28/33  brain]
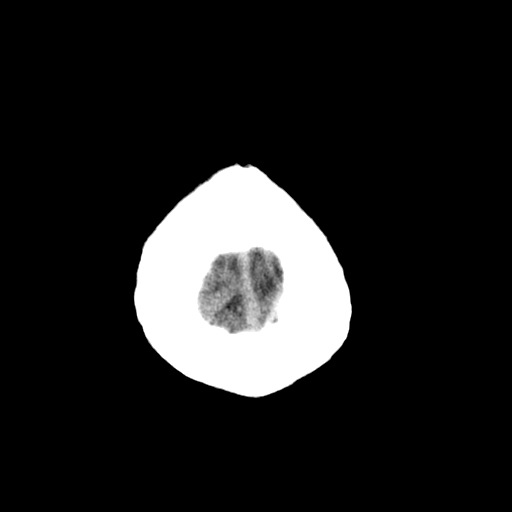
[im 30/33  brain]
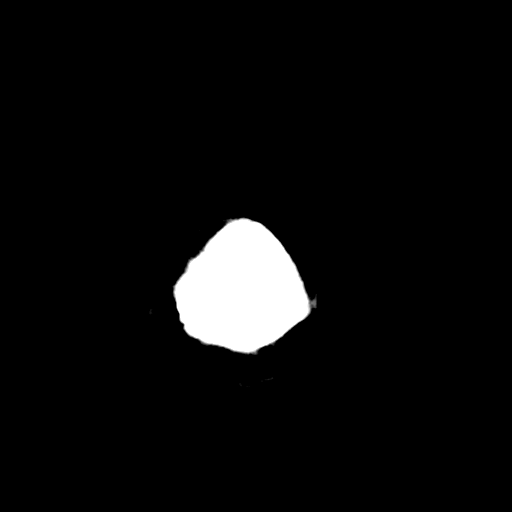
[im 30/33  bone]
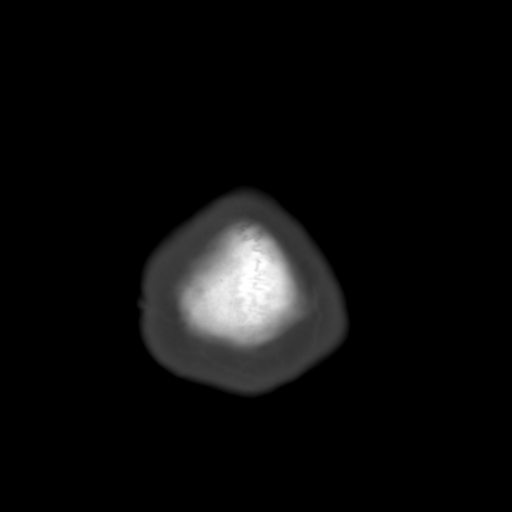

[Series 3: bone · axial · 0.43mm/px · z∈[-596,-552]mm · 3 of 33 slices shown]
[im 3/33  bone]
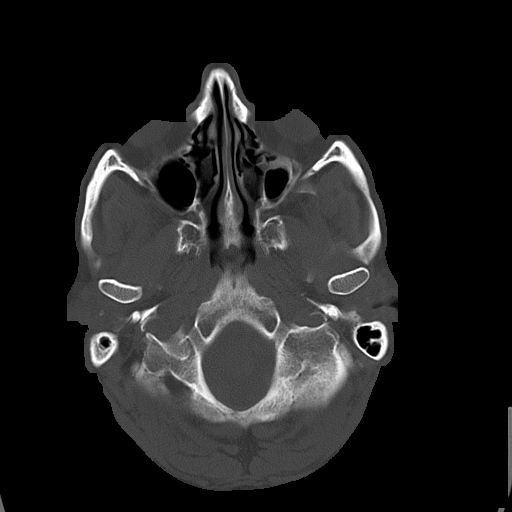
[im 7/33  bone]
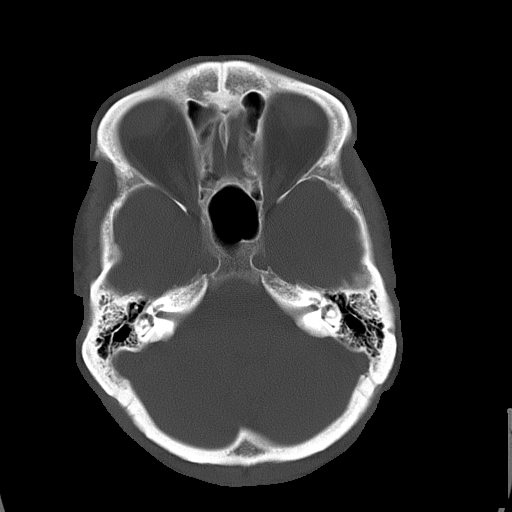
[im 12/33  bone]
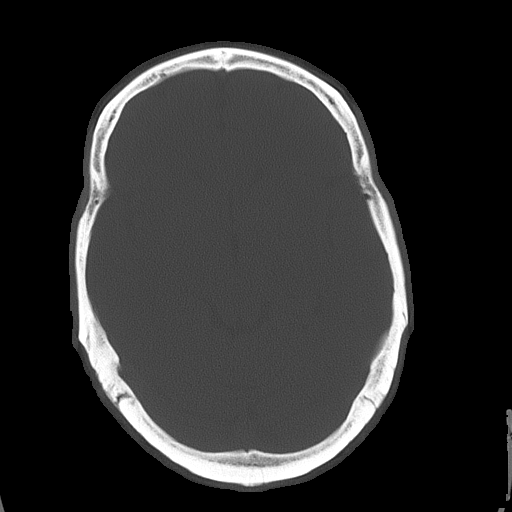

[16 of 30 positions shown; findings below may reference images not displayed]

FINDINGS: There is not evidence of intra-axial fluid collections. There is
no evidence of acute hemorrhage or secondary signs reflecting mass effect or
subacute or chronic focal territorial infarction. The osseous structures
demonstrate no evidence of a depressed skull fracture. If there is
persistent concern clinical follow-up with MRI is recommended.
IMPRESSION: 1. No evidence of acute intracranial abnormalitites.

## 2011-12-25 ENCOUNTER — Encounter (HOSPITAL_COMMUNITY): Payer: Self-pay | Admitting: *Deleted

## 2011-12-25 ENCOUNTER — Emergency Department (HOSPITAL_COMMUNITY)
Admission: EM | Admit: 2011-12-25 | Discharge: 2011-12-26 | Disposition: A | Discharge status: Other Acute Inpatient Hospital | Payer: Self-pay | Attending: Emergency Medicine | Admitting: Emergency Medicine

## 2011-12-25 DIAGNOSIS — Z5900 Homelessness unspecified: Secondary | ICD-10-CM | POA: Insufficient documentation

## 2011-12-25 DIAGNOSIS — S41109A Unspecified open wound of unspecified upper arm, initial encounter: Secondary | ICD-10-CM | POA: Insufficient documentation

## 2011-12-25 DIAGNOSIS — F3289 Other specified depressive episodes: Secondary | ICD-10-CM | POA: Insufficient documentation

## 2011-12-25 DIAGNOSIS — S21109A Unspecified open wound of unspecified front wall of thorax without penetration into thoracic cavity, initial encounter: Secondary | ICD-10-CM | POA: Insufficient documentation

## 2011-12-25 DIAGNOSIS — X789XXA Intentional self-harm by unspecified sharp object, initial encounter: Secondary | ICD-10-CM | POA: Insufficient documentation

## 2011-12-25 DIAGNOSIS — F329 Major depressive disorder, single episode, unspecified: Secondary | ICD-10-CM | POA: Insufficient documentation

## 2011-12-25 DIAGNOSIS — R45851 Suicidal ideations: Secondary | ICD-10-CM | POA: Insufficient documentation
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
## 2011-12-25 NOTE — ED Notes (Signed)
Patient placed and blue scrubs, security called to wand patient belongings that are currently at the nurses station.

## 2011-12-25 NOTE — ED Notes (Signed)
ZOX:[id number]<AV> Expected date:<BR> Expected time:10:54 PM<BR> Means of arrival:Ambulance<BR> Comments:<BR> M211 -- Superficial Lacerations

## 2011-12-25 NOTE — ED Notes (Signed)
Patient states "have had suicidal thoughts the last 3 days that is why I cut myself"

## 2011-12-26 ENCOUNTER — Inpatient Hospital Stay (HOSPITAL_COMMUNITY)
Admission: AD | Admit: 2011-12-26 | Discharge: 2012-01-08 | DRG: 897 | Disposition: A | Discharge status: Home / Self Care | Payer: Federal, State, Local not specified - Other | Source: Ambulatory Visit | Attending: Psychiatry | Admitting: Psychiatry

## 2011-12-26 DIAGNOSIS — S41109A Unspecified open wound of unspecified upper arm, initial encounter: Secondary | ICD-10-CM

## 2011-12-26 DIAGNOSIS — K5900 Constipation, unspecified: Secondary | ICD-10-CM | POA: Diagnosis present

## 2011-12-26 DIAGNOSIS — F1994 Other psychoactive substance use, unspecified with psychoactive substance-induced mood disorder: Principal | ICD-10-CM | POA: Insufficient documentation

## 2011-12-26 DIAGNOSIS — F329 Major depressive disorder, single episode, unspecified: Secondary | ICD-10-CM | POA: Diagnosis present

## 2011-12-26 DIAGNOSIS — F1010 Alcohol abuse, uncomplicated: Secondary | ICD-10-CM | POA: Diagnosis present

## 2011-12-26 DIAGNOSIS — S21109A Unspecified open wound of unspecified front wall of thorax without penetration into thoracic cavity, initial encounter: Secondary | ICD-10-CM

## 2011-12-26 DIAGNOSIS — F10988 Alcohol use, unspecified with other alcohol-induced disorder: Secondary | ICD-10-CM | POA: Diagnosis present

## 2011-12-26 DIAGNOSIS — F603 Borderline personality disorder: Secondary | ICD-10-CM | POA: Diagnosis present

## 2011-12-26 DIAGNOSIS — X789XXA Intentional self-harm by unspecified sharp object, initial encounter: Secondary | ICD-10-CM

## 2011-12-26 DIAGNOSIS — R45851 Suicidal ideations: Secondary | ICD-10-CM

## 2011-12-26 DIAGNOSIS — F1210 Cannabis abuse, uncomplicated: Secondary | ICD-10-CM | POA: Diagnosis present

## 2011-12-26 DIAGNOSIS — Z79899 Other long term (current) drug therapy: Secondary | ICD-10-CM

## 2011-12-26 LAB — DIFFERENTIAL
Basophils Absolute: 0 10*3/uL (ref 0.0–0.1)
Eosinophils Relative: 4 % (ref 0–5)
Lymphocytes Relative: 27 % (ref 12–46)
Neutro Abs: 5.8 10*3/uL (ref 1.7–7.7)

## 2011-12-26 LAB — CBC
MCV: 95.4 fL (ref 78.0–100.0)
Platelets: 240 10*3/uL (ref 150–400)
RDW: 14.1 % (ref 11.5–15.5)
WBC: 9.3 10*3/uL (ref 4.0–10.5)

## 2011-12-26 LAB — BASIC METABOLIC PANEL
CO2: 25 mEq/L (ref 19–32)
Calcium: 9.2 mg/dL (ref 8.4–10.5)
Sodium: 142 mEq/L (ref 135–145)

## 2011-12-26 LAB — RAPID URINE DRUG SCREEN, HOSP PERFORMED
Amphetamines: NOT DETECTED
Opiates: NOT DETECTED

## 2011-12-26 LAB — ETHANOL: Alcohol, Ethyl (B): 76 mg/dL — ABNORMAL HIGH (ref 0–11)

## 2011-12-26 MED ORDER — TOPIRAMATE 25 MG PO TABS
100.0000 mg | ORAL_TABLET | Freq: Every day | ORAL | Status: DC
  Administered 2011-12-26: 100 mg via ORAL
  Filled 2011-12-26: qty 4

## 2011-12-26 MED ORDER — ONDANSETRON HCL 4 MG PO TABS
4.0000 mg | ORAL_TABLET | Freq: Three times a day (TID) | ORAL | Status: DC | PRN
Start: 2011-12-26 — End: 2011-12-26

## 2011-12-26 MED ORDER — TOPIRAMATE 25 MG PO TABS: 50.0000 mg | ORAL_TABLET | ORAL | Status: DC

## 2011-12-26 MED ORDER — NICOTINE 21 MG/24HR TD PT24
21.0000 mg | MEDICATED_PATCH | Freq: Every day | TRANSDERMAL | Status: DC
  Filled 2011-12-26: qty 1

## 2011-12-26 MED ORDER — ACETAMINOPHEN 325 MG PO TABS: 650.0000 mg | ORAL_TABLET | ORAL | Status: DC | PRN

## 2011-12-26 MED ORDER — IBUPROFEN 600 MG PO TABS: 600.0000 mg | ORAL_TABLET | Freq: Three times a day (TID) | ORAL | Status: DC | PRN

## 2011-12-26 MED ORDER — LORAZEPAM 1 MG PO TABS: 1.0000 mg | ORAL_TABLET | Freq: Three times a day (TID) | ORAL | Status: DC | PRN

## 2011-12-26 MED ORDER — ZOLPIDEM TARTRATE 5 MG PO TABS: 5.0000 mg | ORAL_TABLET | Freq: Every evening | ORAL | Status: DC | PRN

## 2011-12-26 MED ORDER — TOPIRAMATE 25 MG PO TABS: 50.0000 mg | ORAL_TABLET | Freq: Every day | ORAL | Status: DC

## 2011-12-26 MED ORDER — CITALOPRAM HYDROBROMIDE 20 MG PO TABS
20.0000 mg | ORAL_TABLET | Freq: Every day | ORAL | Status: DC
  Administered 2011-12-26: 20 mg via ORAL
  Filled 2011-12-26: qty 1

## 2011-12-26 NOTE — ED Notes (Signed)
Telepsych papers faxed 
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
## 2011-12-26 NOTE — BH Assessment (Signed)
Assessment Note   Derek Weaver is an 51 y.o. male.   Patient reports having suicidal thoughts for the past three days.   Patient repots having a plan to cut himself in order to die.  Patient was brought to the Emergency Room by the [facility].  Patient denies any HI.  Patient reports that he was recently released from [facility] jail.    Patient was treated for lacerations on his arm while in the Emergency Room.  Patient reports unpredictable factors that cause him to want to hurt himself.  Patient reports a past history of psychiatric hospitalizations.  Patient denies psychosis.  Patient reports a history of substance abuse.  Patient reports drinking 2 beers yesterday.  Documentation in the file reports that the patient has a BAL of 76.  Patient has not given any samples for a UDS.  Patient reports having an issue with being able to stop drinking when he was 51 years old.       Axis I: Bipolar, Depressed Axis II: Deferred Axis III:  Past Medical History  Diagnosis Date  . Depression   . Anxiety   . Personality disorder    Axis IV: economic problems, occupational problems, other psychosocial or environmental problems, problems related to social environment and problems with primary support group Axis V: 11-20 some danger of hurting self or others possible OR occasionally fails to maintain minimal personal hygiene OR gross impairment in communication  Past Medical History:  Past Medical History  Diagnosis Date  . Depression   . Anxiety   . Personality disorder     History reviewed. No pertinent past surgical history.  Family History: History reviewed. No pertinent family history.  Social History:  reports that he has been smoking Cigarettes.  He has a 13 pack-year smoking history. He has never used smokeless tobacco. He reports that he uses illicit drugs (Marijuana) about once per week. He reports that he does not drink alcohol.  Additional Social  History:     CIWA: CIWA-Ar BP: 105/68 mmHg Pulse Rate: 88  COWS:    Allergies: No Known Allergies  Home Medications:  (Not in a hospital admission)  OB/GYN Status:  No LMP for male patient.  General Assessment Data Location of Assessment: [facility] ED ACT Assessment: Yes Living Arrangements: Other (Comment) Can pt return to current living arrangement?: Yes Admission Status: Voluntary Is patient capable of signing voluntary admission?: Yes Transfer from: Other (Comment) Referral Source: Self/Family/Friend     Risk to self Suicidal Ideation: Yes-Currently Present Suicidal Intent: Yes-Currently Present Is patient at risk for suicide?: Yes Suicidal Plan?: Yes-Currently Present Specify Current Suicidal Plan: cutting himself Access to Means: Yes Specify Access to Suicidal Means: any sharp object What has been your use of drugs/alcohol within the last 12 months?: yes  alcohol  Previous Attempts/Gestures: Yes How many times?: 2  Other Self Harm Risks: none reported Triggers for Past Attempts: Unpredictable Intentional Self Injurious Behavior: Cutting Comment - Self Injurious Behavior: Patient reports cutting when he is depressed  Family Suicide History: No Recent stressful life event(s): Conflict (Comment);Trauma (Comment) Persecutory voices/beliefs?: No Depression: Yes Depression Symptoms: Insomnia;Tearfulness;Isolating;Fatigue;Guilt;Loss of interest in usual pleasures;Feeling worthless/self pity Substance abuse history and/or treatment for substance abuse?: Yes Suicide prevention information given to non-admitted patients: Yes  Risk to Others Homicidal Ideation: No Thoughts of Harm to Others: No Current Homicidal Intent: No Current Homicidal Plan: No Access to Homicidal Means: No Identified Victim: none reported History of harm to others?: No  Assessment of Violence: None Noted Violent Behavior Description: none reported Does patient have access to weapons?: No Criminal  Charges Pending?: No Does patient have a court date: No  Psychosis Hallucinations: None noted Delusions: None noted  Mental Status Report Appear/Hygiene: Disheveled Eye Contact: Poor Motor Activity: Agitation Speech: Pressured;Soft Level of Consciousness: Quiet/awake;Alert Mood: Depressed Affect: Irritable;Sad;Sullen Anxiety Level: None Thought Processes: Coherent;Relevant Judgement: Unimpaired Orientation: Person;Place;Time;Situation Obsessive Compulsive Thoughts/Behaviors: None  Cognitive Functioning Concentration: Decreased Memory: Recent Intact;Remote Intact IQ: Average Insight: Poor Impulse Control: Poor Appetite: Fair Weight Loss: 0  Weight Gain: 0  Sleep: Decreased Total Hours of Sleep: 4  Vegetative Symptoms: None  ADLScreening [facility] Assessment Services) Patient's cognitive ability adequate to safely complete daily activities?: Yes Patient able to express need for assistance with ADLs?: Yes Independently performs ADLs?: Yes  Abuse/Neglect [facility]) Physical Abuse: Denies Verbal Abuse: Denies Sexual Abuse: Denies  Prior Inpatient Therapy Prior Inpatient Therapy: Yes Prior Therapy Dates: [facility]Provider[facility]s): [facility] Reason for Treatment: Depression   Prior Outpatient Therapy Prior Outpatient Therapy: Yes Prior Therapy Dates: cannot remember Prior Therapy Facilty/Provider(s): [facility]  Reason for Treatment: Mood swings   ADL Screening (condition at time of admission) Patient's cognitive ability adequate to safely complete daily activities?: Yes Patient able to express need for assistance with ADLs?: Yes Independently performs ADLs?: Yes       Abuse/Neglect Assessment (Assessment to be complete while patient is alone) Physical Abuse: Denies Verbal Abuse: Denies Sexual Abuse: Denies Values / Beliefs Cultural Requests During Hospitalization: None Spiritual Requests During Hospitalization: None        Additional  Information 1:1 In Past 12 Months?: No CIRT Risk: No Elopement Risk: No Does patient have medical clearance?: Yes     Disposition: Pending placement at [facility].  Disposition Disposition of Patient: Inpatient treatment program Type of inpatient treatment program: Adult  On Site Evaluation by:   Reviewed with Physician:     Phillip Heal LaVerne 12/26/2011 6:22 AM

## 2011-12-26 NOTE — ED Notes (Signed)
Patient Belongings in [place]

## 2011-12-26 NOTE — BH Assessment (Signed)
Assessment Note   Derek Weaver is an 51 y.o. male.   Axis I: Bipolar, Depressed Axis II: Deferred Axis III:  Past Medical History  Diagnosis Date  . Depression   . Anxiety   . Personality disorder    Axis IV: economic problems, occupational problems, problems related to social environment and problems with primary support group Axis V: 1-10 persistent dangerousness to self and others present  Past Medical History:  Past Medical History  Diagnosis Date  . Depression   . Anxiety   . Personality disorder     History reviewed. No pertinent past surgical history.  Family History: History reviewed. No pertinent family history.  Social History:  reports that he has been smoking Cigarettes.  He has a 13 pack-year smoking history. He has never used smokeless tobacco. He reports that he uses illicit drugs (Marijuana) about once per week. He reports that he does not drink alcohol.  Additional Social History:     CIWA: CIWA-Ar BP: 105/68 mmHg Pulse Rate: 88  COWS:    Allergies: No Known Allergies  Home Medications:  (Not in a hospital admission)  OB/GYN Status:  No LMP for male patient.  General Assessment Data [facility]: [facility] ACT Assessment: Yes Living Arrangements: Other (Comment) Can pt return to current living arrangement?: Yes Admission Status: Voluntary Is patient capable of signing voluntary admission?: Yes Transfer from: Other (Comment) Referral Source: Self/Family/Friend     Risk to self Suicidal Ideation: Yes-Currently Present Suicidal Intent: Yes-Currently Present Is patient at risk for suicide?: Yes Suicidal Plan?: Yes-Currently Present Specify Current Suicidal Plan: cutting himself Access to Means: Yes Specify Access to Suicidal Means: any sharp object What has been your use of drugs/alcohol within the last 12 months?: yes  alcohol  Previous Attempts/Gestures: Yes How many times?: 2  Other Self Harm Risks: none reported Triggers  for Past Attempts: Unpredictable Intentional Self Injurious Behavior: Cutting Comment - Self Injurious Behavior: Patient reports cutting when he is depressed  Family Suicide History: No Recent stressful life event(s): Conflict (Comment);Trauma (Comment) Persecutory voices/beliefs?: No Depression: Yes Depression Symptoms: Insomnia;Tearfulness;Isolating;Fatigue;Guilt;Loss of interest in usual pleasures;Feeling worthless/self pity Substance abuse history and/or treatment for substance abuse?: Yes Suicide prevention information given to non-admitted patients: Yes  Risk to Others Homicidal Ideation: No Thoughts of Harm to Others: No Current Homicidal Intent: No Current Homicidal Plan: No Access to Homicidal Means: No Identified Victim: none reported History of harm to others?: No Assessment of Violence: None Noted Violent Behavior Description: none reported Does patient have access to weapons?: No Criminal Charges Pending?: No Does patient have a court date: No  Psychosis Hallucinations: None noted Delusions: None noted  Mental Status Report Appear/Hygiene: Disheveled Eye Contact: Poor Motor Activity: Agitation Speech: Pressured;Soft Level of Consciousness: Quiet/awake;Alert Mood: Depressed Affect: Irritable;Sad;Sullen Anxiety Level: None Thought Processes: Coherent;Relevant Judgement: Unimpaired Orientation: Person;Place;Time;Situation Obsessive Compulsive Thoughts/Behaviors: None  Cognitive Functioning Concentration: Decreased Memory: Recent Intact;Remote Intact IQ: Average Insight: Poor Impulse Control: Poor Appetite: Fair Weight Loss: 0  Weight Gain: 0  Sleep: Decreased Total Hours of Sleep: 4  Vegetative Symptoms: None  ADLScreening [facility] Assessment Services) Patient's cognitive ability adequate to safely complete daily activities?: Yes Patient able to express need for assistance with ADLs?: Yes Independently performs ADLs?: Yes  Abuse/Neglect  [facility]) Physical Abuse: Denies Verbal Abuse: Denies Sexual Abuse: Denies  Prior Inpatient Therapy Prior Inpatient Therapy: Yes Prior Therapy Dates: ukn Prior Therapy Facilty/Provider(s): [facility] Reason for Treatment: Depression   Prior Outpatient Therapy Prior Outpatient  Therapy: Yes Prior Therapy Dates: cannot remember Prior Therapy Facilty/Provider(s): [facility], [facility]  Reason for Treatment: Mood swings   ADL Screening (condition at time of admission) Patient's cognitive ability adequate to safely complete daily activities?: Yes Patient able to express need for assistance with ADLs?: Yes Independently performs ADLs?: Yes       Abuse/Neglect Assessment (Assessment to be complete while patient is alone) Physical Abuse: Denies Verbal Abuse: Denies Sexual Abuse: Denies Values / Beliefs Cultural Requests During Hospitalization: None Spiritual Requests During Hospitalization: None        Additional Information 1:1 In Past 12 Months?: No CIRT Risk: No Elopement Risk: No Does patient have medical clearance?: Yes     Disposition: Pending placement with [facility].  Disposition Disposition of Patient: Inpatient treatment program Type of inpatient treatment program: Adult  On Site Evaluation by:   Reviewed with Physician:     Phillip Heal LaVerne 12/26/2011 6:20 AM

## 2011-12-26 NOTE — BH Assessment (Signed)
Assessment Note   Derek Weaver is an 51 y.o. male.   Derek Weaver is an 51 y.o. male.  Patient reports having suicidal thoughts for the past three days. Patient repots having a plan to cut himself in order to die. Patient was brought to the Emergency Room by the [facility]. Patient denies any HI. Patient reports that he was recently released from [facility] jail.  Patient was treated for lacerations on his arm while in the Emergency Room. Patient reports unpredictable factors that cause him to want to hurt himself. Patient reports a past history of psychiatric hospitalizations.   Patient denies psychosis. Patient reports a history of substance abuse. Patient reports drinking 2 beers yesterday. Documentation in the file reports that the patient has a BAL of 76 yesterday. Patient reports having an issue with being able to stop drinking when he was 51 years old.   Therapist completed support paperwork for the patient to be placed at [facility].     Axis I: Bipolar, Depressed Axis II: Deferred Axis III:  Past Medical History  Diagnosis Date  . Depression   . Anxiety   . Personality disorder    Axis IV: economic problems, educational problems, housing problems, other psychosocial or environmental problems and problems with primary support group Axis V: 11-20 some danger of hurting self or others possible OR occasionally fails to maintain minimal personal hygiene OR gross impairment in communication  Past Medical History:  Past Medical History  Diagnosis Date  . Depression   . Anxiety   . Personality disorder     No past surgical history on file.  Family History: No family history on file.  Social History:  reports that he has been smoking Cigarettes.  He has a 13 pack-year smoking history. He has never used smokeless tobacco. He reports that he uses illicit drugs (Marijuana) about once per week. He reports that he does not drink alcohol.  Additional Social  History:     CIWA:   COWS:    Allergies: No Known Allergies  Home Medications:  (Not in a hospital admission)  OB/GYN Status:  No LMP for male patient.  General Assessment Data [facility] ACT Assessment: Yes Living Arrangements: Other (Comment) Can pt return to current living arrangement?: Yes Admission Status: Voluntary Is patient capable of signing voluntary admission?: Yes Transfer from: Other (Comment) Referral Source: Self/Family/Friend     Risk to self Suicidal Ideation: Yes-Currently Present Suicidal Intent: Yes-Currently Present Is patient at risk for suicide?: Yes Suicidal Plan?: Yes-Currently Present Specify Current Suicidal Plan: cutting Access to Means: Yes Specify Access to Suicidal Means: any sharp object  What has been your use of drugs/alcohol within the last 12 months?: yes, alcoholic  Previous Attempts/Gestures: Yes How many times?: 2  Other Self Harm Risks: Cutting  Triggers for Past Attempts: Unpredictable Intentional Self Injurious Behavior: Cutting Comment - Self Injurious Behavior: Patient reports cutting wihen he is depressed. Family Suicide History: No Recent stressful life event(s): Conflict (Comment);Turmoil (Comment);Trauma (Comment) Persecutory voices/beliefs?: No Depression: Yes Depression Symptoms: Insomnia;Tearfulness;Loss of interest in usual pleasures;Feeling worthless/self pity Substance abuse history and/or treatment for substance abuse?: Yes Suicide prevention information given to non-admitted patients: Yes  Risk to Others Homicidal Ideation: No Thoughts of Harm to Others: No Current Homicidal Intent: No Current Homicidal Plan: No Access to Homicidal Means: No Identified Victim: none reported History of harm to others?: No Assessment of Violence: None Noted Violent Behavior Description: None Reported Does patient  have access to weapons?: No Criminal Charges Pending?: No Does patient have a court date:  No  Psychosis Hallucinations: None noted Delusions: None noted  Mental Status Report Appear/Hygiene: Disheveled Eye Contact: Fair Motor Activity: Echopraxia;Restlessness Speech: Pressured;Soft Level of Consciousness: Quiet/awake;Alert Mood: Depressed Affect: Irritable;Sad;Sullen Anxiety Level: None Thought Processes: Coherent;Relevant Judgement: Unimpaired Orientation: Person;Place;Time;Situation Obsessive Compulsive Thoughts/Behaviors: None  Cognitive Functioning Concentration: Decreased Memory: Recent Intact;Remote Intact IQ: Average Insight: Fair Impulse Control: Poor Appetite: Fair Weight Loss: 0  Weight Gain: 0  Sleep: No Change Total Hours of Sleep: 6  Vegetative Symptoms: None  ADLScreening [facility] Assessment Services) Patient's cognitive ability adequate to safely complete daily activities?: Yes Patient able to express need for assistance with ADLs?: Yes Independently performs ADLs?: Yes  Abuse/Neglect [facility]) Physical Abuse: Denies Verbal Abuse: Denies Sexual Abuse: Denies  Prior Inpatient Therapy Prior Inpatient Therapy: Yes Prior Therapy Dates: ukn Prior Therapy Facilty/Provider(s): [facility] Reason for Treatment: Depression   Prior Outpatient Therapy Prior Outpatient Therapy: Yes Prior Therapy Dates: cannot remember Prior Therapy Facilty/Provider(s): [facility], [facility] Umpstead  Reason for Treatment: Mood swings   ADL Screening (condition at time of admission) Patient's cognitive ability adequate to safely complete daily activities?: Yes Patient able to express need for assistance with ADLs?: Yes Independently performs ADLs?: Yes       Abuse/Neglect Assessment (Assessment to be complete while patient is alone) Physical Abuse: Denies Verbal Abuse: Denies Sexual Abuse: Denies          Additional Information 1:1 In Past 12 Months?: No CIRT Risk: No Elopement Risk: No Does patient have medical clearance?: Yes     Disposition: Patient placed at [facility].    Disposition Disposition of Patient: Inpatient treatment program Type of inpatient treatment program: Adult  On Site Evaluation by:   Reviewed with Physician:     Phillip Heal LaVerne 12/26/2011 9:18 PM

## 2011-12-26 NOTE — ED Provider Notes (Signed)
History     CSN: [id number]  Arrival date & time 12/25/11  2252   First MD Initiated Contact with Patient 12/25/11 2347      Chief Complaint  Patient presents with  . Extremity Laceration    (Consider location/radiation/quality/duration/timing/severity/associated sxs/prior treatment) HPI Comments: Multiple self-inflicted lacerations with a razor. His tetanus is up-to-date. States this was performed because he is unable to live in his recent homeless shelter. Recently [facility] from [facility]  Patient is a 51 y.o. male presenting with mental health disorder. The history is provided by the patient. No language interpreter was used.  Mental Health Problem The primary symptoms do not include dysphoric mood or delusions. The current episode started today. This is a recurrent problem.  The onset of the illness is precipitated by a stressful event. The degree of incapacity that he is experiencing as a consequence of his illness is moderate. Sequelae of the illness include an inability to work and homelessness. Additional symptoms of the illness include feelings of worthlessness. Additional symptoms of the illness do not include no anhedonia, no insomnia, no appetite change, no fatigue, no headaches or no abdominal pain. He admits to suicidal ideas. He does have a plan to commit suicide. He contemplates harming himself. He has already injured self. He does not contemplate injuring another person. He has not already  injured another person. Risk factors that are present for mental illness include a history of mental illness.    Past Medical History  Diagnosis Date  . Depression   . Anxiety   . Personality disorder     History reviewed. No pertinent past surgical history.  History reviewed. No pertinent family history.  History  Substance Use Topics  . Smoking status: Current Everyday Smoker -- 0.5 packs/day for 26 years    Types: Cigarettes  . Smokeless tobacco: Never Used  .  Alcohol Use: No      Review of Systems  Constitutional: Negative for fever, activity change, appetite change and fatigue.  HENT: Negative for congestion, sore throat, rhinorrhea, neck pain and neck stiffness.   Respiratory: Negative for cough and shortness of breath.   Cardiovascular: Negative for chest pain and palpitations.  Gastrointestinal: Negative for nausea, vomiting and abdominal pain.  Genitourinary: Negative for dysuria, urgency, frequency and flank pain.  Musculoskeletal: Negative for myalgias, back pain and arthralgias.  Skin: Positive for wound.  Neurological: Negative for dizziness, weakness, light-headedness, numbness and headaches.  Psychiatric/Behavioral: Positive for suicidal ideas and self-injury. Negative for dysphoric mood. The patient does not have insomnia.   All other systems reviewed and are negative.    Allergies  Review of patient's allergies indicates no known allergies.  Home Medications   Current Outpatient Rx  Name Route Sig Dispense Refill  . CITALOPRAM HYDROBROMIDE 20 MG PO TABS Oral Take 20 mg by mouth daily.    . TOPIRAMATE 50 MG PO TABS Oral Take 50-100 mg by mouth See admin instructions. Takes 100mg  in the morning and 50mg  at night      BP 127/71  Pulse 84  Temp(Src) 98.2 F (36.8 C) (Oral)  Resp 20  SpO2 96%  Physical Exam  Nursing note and vitals reviewed. Constitutional: He is oriented to person, place, and time. He appears well-developed and well-nourished. No distress.  HENT:  Head: Normocephalic and atraumatic.  Mouth/Throat: Oropharynx is clear and moist. No oropharyngeal exudate.  Eyes: Conjunctivae and EOM are normal. Pupils are equal, round, and reactive to light.  Neck: Normal range of motion.  Neck supple.  Cardiovascular: Normal rate, regular rhythm, normal heart sounds and intact distal pulses.  Exam reveals no gallop and no friction rub.   No murmur heard. Pulmonary/Chest: Effort normal and breath sounds normal. No  respiratory distress. He exhibits no tenderness.  Abdominal: Soft. Bowel sounds are normal. There is no tenderness. There is no rebound and no guarding.  Musculoskeletal: Normal range of motion. He exhibits no edema and no tenderness.  Neurological: He is alert and oriented to person, place, and time. No cranial nerve deficit.  Skin: Skin is warm and dry.       Multiple superficial lacerations on the chest and arms bilaterally. These were self-inflicted with a razor. There is no indication for repair. These are all superficial.    ED Course  Procedures (including critical care time)   Labs Reviewed  CBC  DIFFERENTIAL  BASIC METABOLIC PANEL  ETHANOL  URINE RAPID DRUG SCREEN (HOSP PERFORMED)   No results found.   1. Multiple lacerations   2. Depression   3. Suicidal ideation       MDM  Tetanus is up-to-date. Wounds were cleaned with soap and water and dressed. There is no indication for closure as they're superficial. He is medically clear for psychiatric evaluation. Psychiatric orders were placed. Laboratory studies were reviewed. His home medications were ordered. We'll discuss with the ACT team and will await further recommendations for disposition. We'll consider tele-psychiatry consult        Dayton Bailiff, MD 12/26/11 0010

## 2011-12-26 NOTE — Tx Team (Signed)
Initial Interdisciplinary Treatment Plan  PATIENT STRENGTHS: (choose at least two) Capable of independent living Communication skills General fund of knowledge Motivation for treatment/growth Physical Health  PATIENT STRESSORS: Financial difficulties Legal issue Occupational concerns Substance abuse   PROBLEM LIST: Problem List/Patient Goals Date to be addressed Date deferred Reason deferred Estimated date of resolution                                                         DISCHARGE CRITERIA:  Improved stabilization in mood, thinking, and/or behavior Motivation to continue treatment in a less acute level of care Need for constant or close observation no longer present Reduction of life-threatening or endangering symptoms to within safe limits Verbal commitment to aftercare and medication compliance  PRELIMINARY DISCHARGE PLAN: Attend aftercare/continuing care group Outpatient therapy  PATIENT/FAMIILY INVOLVEMENT: This treatment plan has been presented to and reviewed with the patient, Derek Weaver, and/or family member,   The patient and family have been given the opportunity to ask questions and make suggestions.  Noah Charon 12/26/2011, 10:56 PM

## 2011-12-26 NOTE — Progress Notes (Addendum)
Patient cooperative upon admission. Patient endorses passive SI, denies HI. Patient denies A/V hallucinations. Patient verbally contracts for safety.  Patient states he felt stressed, to relieve stress "I cut myself." Patient presents with superficial lacerations to upper torso, bilateral arms and bilateral shoulders. Patient states he recently spent two months in [facility]. Upon discharge he spent 23 days in [facility] for nonpayment of court cost. Patient was forced to live in shelter once released from jail, this increased his stress per patient. Patient states he has been "sleeping in the woods for four days and has not had his medications in two days. Two days ago patient called suicide hotline for increasing SI, then was taken to ED by police. Patient states he rarely drinks alcohol, 3 drinks in the last 5 months. Patient oriented to unit, verbalizes understanding. Patient safe on unit Q15 minutes checks continue. Will continue to monitor. Patient states he has hidden medication "in the woods" in [place] to use for overdose.

## 2011-12-27 DIAGNOSIS — F10988 Alcohol use, unspecified with other alcohol-induced disorder: Secondary | ICD-10-CM

## 2011-12-27 DIAGNOSIS — F603 Borderline personality disorder: Secondary | ICD-10-CM

## 2011-12-27 DIAGNOSIS — F1210 Cannabis abuse, uncomplicated: Secondary | ICD-10-CM

## 2011-12-27 DIAGNOSIS — F1010 Alcohol abuse, uncomplicated: Secondary | ICD-10-CM

## 2011-12-27 DIAGNOSIS — R45851 Suicidal ideations: Secondary | ICD-10-CM

## 2011-12-27 DIAGNOSIS — F1994 Other psychoactive substance use, unspecified with psychoactive substance-induced mood disorder: Principal | ICD-10-CM

## 2011-12-27 MED ORDER — TOPIRAMATE 100 MG PO TABS
100.0000 mg | ORAL_TABLET | Freq: Every day | ORAL | Status: DC
  Administered 2011-12-27 – 2012-01-08 (×13): 100 mg via ORAL
  Filled 2011-12-27 (×8): qty 1
  Filled 2011-12-27: qty 21
  Filled 2011-12-27 (×6): qty 1

## 2011-12-27 MED ORDER — ACETAMINOPHEN 325 MG PO TABS: 650.0000 mg | ORAL_TABLET | Freq: Four times a day (QID) | ORAL | Status: DC | PRN

## 2011-12-27 MED ORDER — MAGNESIUM HYDROXIDE 400 MG/5ML PO SUSP: 30.0000 mL | Freq: Every day | ORAL | Status: DC | PRN

## 2011-12-27 MED ORDER — ALUM & MAG HYDROXIDE-SIMETH 200-200-20 MG/5ML PO SUSP: 30.0000 mL | ORAL | Status: DC | PRN

## 2011-12-27 MED ORDER — CHLORDIAZEPOXIDE HCL 25 MG PO CAPS
25.0000 mg | ORAL_CAPSULE | Freq: Four times a day (QID) | ORAL | Status: DC | PRN
Start: 2011-12-27 — End: 2012-01-08

## 2011-12-27 MED ORDER — NICOTINE 21 MG/24HR TD PT24
21.0000 mg | MEDICATED_PATCH | Freq: Every day | TRANSDERMAL | Status: DC
  Administered 2011-12-27 – 2012-01-08 (×13): 21 mg via TRANSDERMAL
  Filled 2011-12-27 (×15): qty 1

## 2011-12-27 MED ORDER — CITALOPRAM HYDROBROMIDE 40 MG PO TABS
40.0000 mg | ORAL_TABLET | Freq: Every day | ORAL | Status: DC
  Administered 2011-12-27 – 2012-01-04 (×9): 40 mg via ORAL
  Filled 2011-12-27 (×12): qty 1

## 2011-12-27 MED ORDER — TOPIRAMATE 25 MG PO TABS
50.0000 mg | ORAL_TABLET | Freq: Every day | ORAL | Status: DC
  Administered 2011-12-27 – 2012-01-07 (×12): 50 mg via ORAL
  Filled 2011-12-27 (×15): qty 2

## 2011-12-27 NOTE — Progress Notes (Signed)
Lying quietly in bed with eyes closed.  Safety checks are being conducted Q 15 minutes.

## 2011-12-27 NOTE — Tx Team (Signed)
Interdisciplinary Treatment Plan Update (Adult)  Date:  12/27/2011  Time Reviewed:  10:07 AM   Progress in Treatment: Attending groups: Yes Participating in groups:  Yes Taking medication as prescribed:  Yes Tolerating medication: Yes Family/Significant othe contact made:  Reports no support people to contact Patient understands diagnosis: Yes Discussing patient identified problems/goals with staff:  Yes Medical problems stabilized or resolved: Yes Denies suicidal/homicidal ideation: Yes Issues/concerns per patient self-inventory:  No  Other:  New problem(s) identified: None  Reason for Continuation of Hospitalization: Suicidal, Depressed, Medication Stabilization  Interventions implemented related to continuation of hospitalization:  Medication stabilization, safety checks q 15 mins, group attendance  Additional comments:  Estimated length of stay:  3-5 days  Discharge Plan: Unsure of where he will discharge, follow up with [facility]  New goal(s):  Review of initial/current patient goals per problem list:   1.  Goal(s): Decrease rating of depressive symptoms to 4 or less  Met:  Yes  Target date: by discharge  As evidenced by: Kalan rates depression at 4  2.  Goal (s): Reduce potential for self-harm   Met:  Yes  Target date: by discharge  As evidenced by:  Hussein denies suicidal thoughts today  3.  Goal(s): Decrease rating of anxiety symptoms to 4 or less  Met:  No  Target date: by dishcarge  As evidenced by: Maureen rates anxiety today at 5-6  4.  Goal(s): Medication stabilization  Met:  No  Target date: by discharge  As evidenced by: Clide Cliff is beginning to receive medications today, not yet stabilized   Attendees: Patient:     Family:     Physician:  Dr Orson Aloe, MD 12/27/2011 10:07 AM  Nursing:   Shelda Jakes, RN 12/27/2011 10:07 AM  Case Manager:  Juline Patch, LCSW 12/27/2011 10:07 AM  Counselor:  Angus Palms, LCSW 12/27/2011 10:07 AM    Other:  Reyes Ivan, LCSWA 12/27/2011 10:07 AM  Other:  Omelia Blackwater, RN 12/27/2011 10:07 AM  Other:  Nestor Ramp, RN 12/27/2011 10:07 AM  Other:      Scribe for Treatment Team:   [name], 12/27/2011 10:07 AM

## 2011-12-27 NOTE — Progress Notes (Signed)
Patient ID: Derek Weaver, male   DOB: 1961-05-25, 51 y.o.   MRN: [id number] Pt. Reports depression at "4" of 10. Pt. Noted that he left [facility] went to [facility] then to jail, says jail did not follow thru with plans made by [facility] for him to go to long term tx. Pt. Wants to continue tx in long term facility. Pt. Denies SHI. Writer encouraged pt. To speak to physician and CM about discharge plans. Staff will monitor for safety.

## 2011-12-27 NOTE — Progress Notes (Signed)
12/27/2011         Time: 1415      Group Topic/Focus: The focus of this group is on enhancing the patient's understanding of leisure, barriers to leisure, and the importance of engaging in positive leisure activities upon discharge for improved total health.  Participation Level: Active  Participation Quality: Resistant  Affect: Blunted  Cognitive: Oriented  Additional Comments: Patient isolative, stood behind the rest of the group looking out into the field, interacted very little with others.  Ysabella Babiarz 12/27/2011 3:55 PM

## 2011-12-27 NOTE — H&P (Signed)
Psychiatric Admission Assessment Adult  Patient Identification:  Derek Weaver  Date of Evaluation:  12/27/2011  Chief Complaint:  Bipolar Disorder  History of Present Illness:: This is a 51 year old Caucasian male. [name]. Patient reports, "I came from [facility] ED. I had to go to the hospital to be checked out because I have been having suicidal thoughts and my depression is getting worse. This has been going on x 2 weeks. When I get to feeling this way, I cut myself pretty bad to feel better. I was at the [facility] x 2 months from February to April the 11th of this year. When I was being discharged, the social worker at the hospital was trying to arrange a place for me to live. But as soon I got out, I was thrown back into jail for 23 days for nonpayment of court fees. I got out of jail about 9 days ago. I have been living at the [facility] here in [place]. But I got fed up of living with drunks, loafers and drug addicts. So I left there and have been living in the woods, under bridge and any place that people can't get close to me or come around. I still do not know how to handle my depression and suicidal thoughts. I take Topamax 100 mg daily, and 50 mg at night, Citalopram 40 mg daily"   Mood Symptoms:  Anhedonia, Depression, Hopelessness, Past 2 Weeks, Sadness, SI,  Depression Symptoms:  depressed mood, suicidal thoughts without plan,  (Hypo) Manic Symptoms:  Irritable Mood,  Anxiety Symptoms:  Excessive Worry,  Psychotic Symptoms:  Hallucinations: None  PTSD Symptoms: Had a traumatic exposure:  None reported  Past Psychiatric History: Diagnosis:Substance induced mood disorder, Cannabis , abuse  Hospitalizations: [facility], [facility] x 3  Outpatient Care: [facility]  Substance Abuse Care: None reported  Self-Mutilation: "I cut myself to feel better"  Suicidal Attempts: Denies  attempts, admits thoughts.  Violent Behaviors: None reported   Past Medical History:   Past Medical History  Diagnosis Date  . Depression   . Anxiety   . Personality disorder      Allergies:  No Known Allergies  PTA Medications: Prescriptions prior to admission  Medication Sig Dispense Refill  . citalopram (CELEXA) 20 MG tablet Take 20 mg by mouth daily.      Marland Kitchen topiramate (TOPAMAX) 50 MG tablet Take 50-100 mg by mouth See admin instructions. Takes 100mg  in the morning and 50mg  at night         Substance Abuse History in the last 12 months: Substance Age of 1st Use Last Use Amount Specific Type  Nicotine 20 Prior to hosp 1/2 packs daily Cigarettes  Alcohol 24 "I drink occasionally"  Beer  Cannabis 33 "I smoke 2 times in a month"  Marijuana  Opiates Denies use     Cocaine Denies use     Methamphetamines Denies use     LSD Denies use     Ecstasy Denies use     Benzodiazepines Denies use     Caffeine      Inhalants      Others:                         Consequences of Substance Abuse: Medical Consequences:  Liver damage Legal Consequences:  Arrests, jail time Family Consequences:  Family discord  Social History: Current Place  of Residence:  [address] of Birth:  [place]  Family Members: "I don't have my family around"  Marital Status:  Divorced  Children: 0  Sons:0  Daughters:0  Relationships: "I'm not in any relationship"  Education:  Automotive engineer (2 years)  Educational Problems/Performance: None reported  Religious Beliefs/Practices: None reported  History of Abuse (Emotional/Phsycial/Sexual): none reported  Occupational Experiences: Employed, part-time.  Military History:  None.  Legal History: "Recently served time for non-payment of court fees"  Hobbies/Interests None reported:  Family History:  No family history on file.  Mental Status Examination/Evaluation: Objective:  Appearance: Disheveled  Eye Contact::  Good  Speech:  Clear and  Coherent  Volume:  Normal  Mood:  Depressed  Affect:  Flat  Thought Process:  Coherent  Orientation:  Full  Thought Content:  Hallucinations: None  Suicidal Thoughts:  Yes.  without intent/plan  Homicidal Thoughts:  No  Memory:  Immediate;   Good Recent;   Good Remote;   Good  Judgement:  Good  Insight:  Fair  Psychomotor Activity:  Normal  Concentration:  Good  Recall:  Good  Akathisia:  No  Handed:  Right  AIMS (if indicated):     Assets:  Desire for Improvement  Sleep:  Number of Hours: 6     Laboratory/X-Ray: None Psychological Evaluation(s)      Assessment:    AXIS I:  Substance Induced Mood Disorder AXIS II:  Borderline Personality Dis. AXIS III:   Past Medical History  Diagnosis Date  . Depression   . Anxiety   . Personality disorder    AXIS IV:  economic problems, housing problems, occupational problems, other psychosocial or environmental problems, problems related to social environment and substance abuse and dependency AXIS V:  11-20 some danger of hurting self or others possible OR occasionally fails to maintain minimal personal hygiene OR gross impairment in communication  Treatment Plan/Recommendations: Admit for safety and stabilization. Review and reinstate any pertinent home medications for other health conditions. Citalopram 40 mg for depressive mood,  Topamax 100 mg Q am, and  50 mg Q bedtime.  Treatment Plan Summary: Daily contact with patient to assess and evaluate symptoms and progress in treatment Medication management  Current Medications:  Current Facility-Administered Medications  Medication Dose Route Frequency Provider Last Rate Last Dose  . acetaminophen (TYLENOL) tablet 650 mg  650 mg Oral Q6H PRN Mike Craze, MD      . alum & mag hydroxide-simeth (MAALOX/MYLANTA) 200-200-20 MG/5ML suspension 30 mL  30 mL Oral Q4H PRN Mike Craze, MD      . chlordiazePOXIDE (LIBRIUM) capsule 25 mg  25 mg Oral Q6H PRN Mike Craze, MD      .  magnesium hydroxide (MILK OF MAGNESIA) suspension 30 mL  30 mL Oral Daily PRN Mike Craze, MD      . nicotine (NICODERM CQ - dosed in mg/24 hours) patch 21 mg  21 mg Transdermal Q0600 Mike Craze, MD   21 mg at 12/27/11 4098   Facility-Administered Medications Ordered in Other Encounters  Medication Dose Route Frequency Provider Last Rate Last Dose  . DISCONTD: acetaminophen (TYLENOL) tablet 650 mg  650 mg Oral Q4H PRN Dayton Bailiff, MD      . DISCONTD: citalopram (CELEXA) tablet 20 mg  20 mg Oral Daily Dayton Bailiff, MD   20 mg at 12/26/11 0958  . DISCONTD: ibuprofen (ADVIL,MOTRIN) tablet 600 mg  600 mg Oral Q8H PRN Dayton Bailiff, MD      .  DISCONTD: LORazepam (ATIVAN) tablet 1 mg  1 mg Oral Q8H PRN Dayton Bailiff, MD      . DISCONTD: nicotine (NICODERM CQ - dosed in mg/24 hours) patch 21 mg  21 mg Transdermal Daily Dayton Bailiff, MD      . DISCONTD: ondansetron [facility]) tablet 4 mg  4 mg Oral Q8H PRN Dayton Bailiff, MD      . DISCONTD: topiramate (TOPAMAX) tablet 100 mg  100 mg Oral Daily Dayton Bailiff, MD   100 mg at 12/26/11 0959  . DISCONTD: topiramate (TOPAMAX) tablet 50 mg  50 mg Oral QHS Dayton Bailiff, MD      . DISCONTD: zolpidem (AMBIEN) tablet 5 mg  5 mg Oral QHS PRN Dayton Bailiff, MD        Observation Level/Precautions:  Q 15 minutes checks for safety  Laboratory:  Per ED lab reports: (+) THC  Psychotherapy:  Group  Medications:  See medication lists  Routine PRN Medications:  Yes  Consultations: None indicated  Discharge Concerns: Safety  Other:     [name] I 5/29/201310:53 AM

## 2011-12-27 NOTE — Progress Notes (Signed)
Patient seen during d/c planning group.  He advised of admitting to the hospital due to becoming depressed and cutting. Patient shared he was in [facility] a few months ago and that he spent time in jail following discharge.  Patient left group early to meet with nurse practitioner.

## 2011-12-27 NOTE — Progress Notes (Signed)
[facility] Group Notes: (Counselor/Nursing/MHT/Case Management/Adjunct) 12/27/2011    11:00am Emotion Regulation  Type of Therapy:  Group Therapy  Participation Level:  Active  Participation Quality: Appropriate, Sharing, Supportive    Affect:  Blunted  Cognitive:  Appropriate  Insight:  Good  Engagement in Group: Good  Engagement in Therapy:  Good  Modes of Intervention:  Support and Exploration  Summary of Progress/Problems: Derek Weaver was very engaged in group. He shared his opinion of shame, which is that it is not a label one applies to oneself, but that one allows others to give them. He processed trying to evade shame, but hearing so often that one is bad or worthless or should be ashamed eats away at a person and one gives in. Derek Weaver explored how difficult it is to not let others' opinions and statements impact one's mood and self-worth, even when one knows that is the right thing to do. He talked about how he is ashamed to come back to the hospital because he was trying so hard to do well, and he doesn't want to let down the doctor and others who believe in him. Received appropriate feedback that coming back into the hospital is nothing to be ashamed of and that the doctors/staff do not take it personally. Derek Weaver was able to say by the end of group that he has been here 3 times but has learned new things each time and that he will spend his energy on recover rather than shaming himself.  Derek Weaver 12/27/2011  3:38 PM      [facility] Group Notes: (Counselor/Nursing/MHT/Case Management/Adjunct) 12/27/2011   @1 :15pm Breathing & Meditation for Anxiety/Anger   Type of Therapy:  Group Therapy  Participation Level:  Good  Participation Quality: Good    Affect:  Blunted  Cognitive:  Appropriate  Insight:  Good  Engagement in Group: Good  Engagement in Therapy:  Good  Modes of Intervention:  Support and Exploration  Summary of Progress/Problems: Derek Weaver participated in  focused breathing and muscle relaxation exercises. He processed how meditation of many kinds has been helpful to him as a coping skill, and pointed out that he had to make meditation a regular part of his life before it came easily to him to use that skill in a time of crisis. Derek Weaver encouraged others to try various types of meditation.  Derek Weaver 12/27/2011 3:44 PM

## 2011-12-27 NOTE — Progress Notes (Signed)
Pt pleasant and cooperative. Pt rates depression at a 4 and hopelessness at a 3. Pt attends groups and interacts well with peers and staff. Pt stated prior to coming in here he was living in the woods and is not sure where he will be going to when he leaves here.  Pt was offered support and encouragement. Pt denies SI/HI. Pt is receptive to treatment and safety is maintained on unit.

## 2011-12-27 NOTE — H&P (Signed)
Medical/psychiatric screening examination/treatment/procedure(s) were performed by non-physician practitioner and as supervising physician I was immediately available for consultation/collaboration.  I have seen and examined this patient and agree the major elements of this evaluation.  

## 2011-12-27 NOTE — Progress Notes (Signed)
Adult Psychosocial Assessment Update Interdisciplinary Team  [facility] admissions[facility]discharges:  Admissions Discharges  Date: 08/02/11 Date: 08/07/11  Date: 04/30/11 Date: 05/17/11  Date: Date:  Date: Date:  Date: Date:   Changes since the last Psychosocial Assessment (including adherence to outpatient mental health and/or substance abuse treatment, situational issues contributing to decompensation and/or relapse). [name] reports that he spent a while at [facility] after leaving here the last time,   And that was very helpful to him. [facility] had a plan to discharge him somewhere in   [place], but was unable to due to him having charges in [place]. [name] to jail in [place] for not paying court costs, then was released, but he  States that the people at the jail "did not follow through" on sending him to the place that   [facility] had set up for him.(He does not remember the name of the place) Since being released from jail, he spent 2 weeks at a homeless shelter before living to live under a bridge. Started drinking again and became very depressed and suicidal, and called suicide hotline. [facility] sent the police to pick him up after he had cut himself in several places with a razor. He reports the cutting was to relieve stress, but that he was suicidal at the time.    Discharge Plan 1. Will you be returning to the same living situation after discharge?   Yes: No: X     If no, what is your plan?    [name] is homeless and wants to find a way to get to the program in [place] that [facility]   Had told him about.     2. Would you like a referral for services when you are discharged? Yes:  X    If yes, for what services?  No:       Does not like services at [facility], but that is the only option as he has no insurance. He  Would like an ACT team, as people at [facility] explained that service to him and he thinks it  Sounds like what he needs, along with  individual therapy.   Summary and Recommendations (to be completed by the evaluator) [name] is a 51 year old single male diagnosed with Bipolar Disorder. He reports that he   Had stopped drinking, but had one beer when he became depressed, and "it was on   Again", meaning that once he started he was unable to stop drinking. Very interested in   A program in [place] but does not know what the program was. States he was in a   [facility] in [place] for 3 weeks before being incarcerated, but when   Asked about an [facility] in [place] he reported that he would prefer to go to  Some place with no emphasis on alcohol or drug recovery. He reports that if he is   Around addicts, even if they are focused on staying clean, it triggers him to drink . It was  Explained to [place] that there are little to no housing options outside of SA treatment. He  Would benefit from crisis stabilization, medication evaluation, therapy groups for   Processing thoughts/feelings/experiences, psychoed groups and case management for discharge planning.   Signature:  [name], 12/27/2011 9:18 AM

## 2011-12-28 NOTE — Progress Notes (Addendum)
Patient seen during d/c planning group.  He denies SI/HI today but reports having SI last night.  Patient advised he was drinking two quarts of beer daily prior to admission but does not believe he needs SA treatment.  Patient advised of being interested in going to [facility] at discharge.  Patient rates depression at four, anxiety at two and helplessness at two.

## 2011-12-28 NOTE — Progress Notes (Signed)
Derek Weaver attended and participated in group today. He interacts with peers and staff. He reports a low energy level. He reports his depression as 4 and feelings of hopelessness as 4. He is in bed resting this afternoon.  Has SI off and on.

## 2011-12-28 NOTE — Tx Team (Signed)
Interdisciplinary Treatment Plan Update (Adult)  Date:  12/28/2011  Time Reviewed:  11:01 AM   Progress in Treatment: Attending groups:   Yes   Participating in groups:  Yes Taking medication as prescribed:  Yes Tolerating medication:  Yes Family/Significant othe contact made: No family involvement Patient understands diagnosis:  Yes Discussing patient identified problems/goals with staff: Yes Medical problems stabilized or resolved: Yes Denies suicidal/homicidal ideation:Yes Issues/concerns per patient self-inventory:  Other:  New problem(s) identified:  Reason for Continuation of Hospitalization: Anxiety Depression Medication stabilization  Interventions implemented related to continuation of hospitalization:  Medication Management; safety checks q 15 mins  Additional comments:  Estimated length of stay:  4-5 days  Discharge Plan: Discharge to [facility] with outpatient follow up  New goal(s):  Review of initial/current patient goals per problem list:    1.  Goal(s): Eliminate SI/other thoughts of self harm   Met:  Yes  Target date: d/c  As evidenced by: Patient will no longer endorse SI/other thought self harm  2.  Goal (s):  Reduce depression/anxiety   Met:  No  Target date: d/c  As evidenced by: Patient currently rating symptoms at four or below  3.  Goal(s): .stabilize on meds   Met:  No  Target date: d/c  As evidenced by: Patient will report being stable on medications - symptoms have decreased  4.  Goal(s): Refer for outpatient follow up   Met:  Yes  Target date: d/c  As evidenced by: Follow up appointment will be scheduled    Attendees: Patient:  Derek Weaver 12/28/2011 11:10 AM   Family:     Physician:  Orson Aloe, MD 12/28/2011 11:01 AM   Nursing:   Omelia Blackwater 12/28/2011 11:01 AM   CaseManager:  Juline Patch, LCSW 12/28/2011 11:01 AM   Counselor:  Angus Palms, LCSW 12/28/2011 11:01 AM    Scribe for Treatment Team:     Wynn Banker, LCSW,  12/28/2011 11:01 AM

## 2011-12-28 NOTE — Progress Notes (Signed)
[facility] MD Progress Note  12/28/2011 11:41 AM  Diagnosis:  Axis I: Substance Abuse and Substance Induced Mood Disorder Axis II: Borderline Personality Dis.  ADL's:  Intact  Sleep: Poor  Appetite:  Fair  Suicidal Ideation:  Pt still has some suicidal thoughts, but contracts for safety. Homicidal Ideation:  Denies adamantly any homicidal thoughts.  Mental Status Examination/Evaluation: Objective:  Appearance: Casual  Eye Contact::  Good  Speech:  Clear and Coherent  Volume:  Normal  Mood:  Anxious and Depressed  Affect:  Congruent  Thought Process:  Coherent  Orientation:  Full  Thought Content:  WDL  Suicidal Thoughts:  Yes.  without intent/plan  Homicidal Thoughts:  No  Memory:  Immediate;   Fair  Judgement:  Fair  Insight:  Fair  Psychomotor Activity:  Normal  Concentration:  Fair  Recall:  Fair  Akathisia:  No  Handed:  Right  AIMS (if indicated):     Assets:  Communication Skills Desire for Improvement  Sleep:  Number of Hours: 6.5    Vital Signs:Blood pressure 98/70, pulse 65, temperature 97.8 F (36.6 C), temperature source Oral, resp. rate 16, height 5\' 9"  (1.753 m), weight 78.019 kg (172 lb). Current Medications: Current Facility-Administered Medications  Medication Dose Route Frequency Provider Last Rate Last Dose  . acetaminophen (TYLENOL) tablet 650 mg  650 mg Oral Q6H PRN Mike Craze, MD      . alum & mag hydroxide-simeth (MAALOX/MYLANTA) 200-200-20 MG/5ML suspension 30 mL  30 mL Oral Q4H PRN Mike Craze, MD      . chlordiazePOXIDE (LIBRIUM) capsule 25 mg  25 mg Oral Q6H PRN Mike Craze, MD      . citalopram (CELEXA) tablet 40 mg  40 mg Oral Daily Sanjuana Kava, NP   40 mg at 12/28/11 7829  . magnesium hydroxide (MILK OF MAGNESIA) suspension 30 mL  30 mL Oral Daily PRN Mike Craze, MD      . nicotine (NICODERM CQ - dosed in mg/24 hours) patch 21 mg  21 mg Transdermal Q0600 Mike Craze, MD   21 mg at 12/28/11 0656  . topiramate (TOPAMAX)  tablet 100 mg  100 mg Oral Daily Sanjuana Kava, NP   100 mg at 12/28/11 0829  . topiramate (TOPAMAX) tablet 50 mg  50 mg Oral Q2000 Sanjuana Kava, NP   50 mg at 12/27/11 2010    Lab Results: No results found for this or any previous visit (from the past 48 hour(s)).  Physical Findings: AIMS:  , ,  ,  ,    CIWA:  CIWA-Ar Total: 2  COWS:     Treatment Plan Summary: Daily contact with patient to assess and evaluate symptoms and progress in treatment Medication management Mood and anxiety are below 3/10 on the scale of 1 is the best and 10 is the worst, no suicidal thoughts for 48 hours.  Plan: Admit and restart his most recent effective medications.  Plan on following up with the plan put in to place by [facility] recently.  Derek Weaver 12/27/2011, 11:41 AM

## 2011-12-28 NOTE — [facility] Counselor (Addendum)
Group Therapy Note  Date:  12/28/2011 Time:  11:00  Group Topic/Focus:  Balance  Participation Level:  Minimal  Participation Quality:  Appropriate  Affect:  Appropriate  Cognitive:  Appropriate  Insight:  Good  Engagement in Group:  Limited  Additional Comments:  Derek Weaver was present for a part of the group. Overall, he was insightful but quiet. He reported that he considers camping a pleasurable activity. Also, he finds being affectionate and caring towards his wife as a pleasurable activity.   Christy Sartorius. 12/28/2011, 12:26 PM    [facility] Group Notes:  (Counselor/Nursing/MHT/Case Management/Adjunct)  12/28/2011 1:15pm [facility]  Type of Therapy:  Group Therapy  Participation Level:  Did Not Attend    [name] 12/28/2011  2:48 PM
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
## 2011-12-29 DIAGNOSIS — F1010 Alcohol abuse, uncomplicated: Secondary | ICD-10-CM | POA: Diagnosis present

## 2011-12-29 NOTE — Progress Notes (Signed)
Patient ID: Derek Weaver, male   DOB: 1961/02/13, 51 y.o.   MRN: [id number] Pt. Reports depression at "4" of 10. Pt. Worrying about where he will go after discharge, looking for a transition home. Pt. Denies SHI. Staff will monitor q42min for safety.

## 2011-12-29 NOTE — Progress Notes (Signed)
12/29/2011         Time: 1415      Group Topic/Focus: The focus of this group is on discussing various styles of communication and communicating assertively using 'I' (feeling) statements.  Participation Level: Active  Participation Quality: Appropriate and Attentive  Affect: Appropriate  Cognitive: Oriented   Additional Comments: None.   Savera Donson 12/29/2011 3:56 PM

## 2011-12-29 NOTE — Progress Notes (Signed)
Patient reports that his sleep was fair last night, appetite good, energy level low and ability to pay attention improving. Rates depression as 3/10 and Hopelessness as 0/10. Reports passive SI and contracts for safety. No physical complaints.

## 2011-12-29 NOTE — Progress Notes (Signed)
Patient seen during d/c planning group.  Patient reports being a little better today and rates depression/helplessness at two.  He denies SI or other thoughts of self-harm today but endorsed thoughts of cutting yesterday.  Patient shared he can contract for safety if he feels he would act out on any thoughts of self harm.

## 2011-12-29 NOTE — Progress Notes (Addendum)
[facility] MD Progress Note  12/29/2011 12:55 PM  Diagnosis:  Axis I: Substance Abuse and Substance Induced Mood Disorder Axis II: Borderline Personality Dis.  ADL's:  Intact  Sleep: Poor, didn't ask for anything for helping with sleep, but declines any offers of any medications for insomnia.  Appetite:  Fair  Suicidal Ideation:  Pt had some fleeting suicidal/self harm thoughts last night, but none today. Homicidal Ideation:  Denies adamantly any homicidal thoughts.  Mental Status Examination/Evaluation: Objective:  Appearance: Casual  Eye Contact::  Good  Speech:  Clear and Coherent  Volume:  Normal  Mood:  Anxious and Depressed, maybe slightly better.  Affect:  Congruent  Thought Process:  Coherent  Orientation:  Full  Thought Content:  WDL  Suicidal Thoughts:  Yes.  without intent/plan  Homicidal Thoughts:  No  Memory:  Immediate;   Fair  Judgement:  Fair  Insight:  Fair  Psychomotor Activity:  Normal  Concentration:  Fair  Recall:  Fair  Akathisia:  No  Handed:  Right  AIMS (if indicated):     Assets:  Communication Skills Desire for Improvement  Sleep:  Number of Hours: 6.75    ROS: Neuro: no headaches, ataxia, weakness  GI: no N/V/D/cramps/constipation  MS: no weakness, muscle cramps, aches.  Vital Signs:Blood pressure 112/64, pulse 64, temperature 97.3 F (36.3 C), temperature source Oral, resp. rate 15, height 5\' 9"  (1.753 m), weight 78.019 kg (172 lb). Current Medications: Current Facility-Administered Medications  Medication Dose Route Frequency Provider Last Rate Last Dose  . acetaminophen (TYLENOL) tablet 650 mg  650 mg Oral Q6H PRN Mike Craze, MD      . alum & mag hydroxide-simeth (MAALOX/MYLANTA) 200-200-20 MG/5ML suspension 30 mL  30 mL Oral Q4H PRN Mike Craze, MD      . chlordiazePOXIDE (LIBRIUM) capsule 25 mg  25 mg Oral Q6H PRN Mike Craze, MD      . citalopram (CELEXA) tablet 40 mg  40 mg Oral Daily Sanjuana Kava, NP   40 mg at 12/29/11  0837  . magnesium hydroxide (MILK OF MAGNESIA) suspension 30 mL  30 mL Oral Daily PRN Mike Craze, MD      . nicotine (NICODERM CQ - dosed in mg/24 hours) patch 21 mg  21 mg Transdermal Q0600 Mike Craze, MD   21 mg at 12/29/11 0645  . topiramate (TOPAMAX) tablet 100 mg  100 mg Oral Daily Sanjuana Kava, NP   100 mg at 12/29/11 0837  . topiramate (TOPAMAX) tablet 50 mg  50 mg Oral Q2000 Sanjuana Kava, NP   50 mg at 12/28/11 2016    Lab Results: No results found for this or any previous visit (from the past 48 hour(s)).  Physical Findings: AIMS:  , ,  ,  ,    CIWA:  CIWA-Ar Total: 2  COWS:     Treatment Plan Summary: Daily contact with patient to assess and evaluate symptoms and progress in treatment Medication management Mood and anxiety are below 3/10 on the scale of 1 is the best and 10 is the worst, no suicidal thoughts for 48 hours.  Plan: Pt seen in consult room today.  He described having some fleeting thoughts of self harm last night.  I encouraged him to keep trying to manage them with his meditation.  He said that it is hard and sometimes ineffective, but he is trying to keep it up.  He plans this weekend to have his questions ready  [facility] and call and ask them.  [name], Sadee Osland 12/29/2011 12:55 PM

## 2011-12-29 NOTE — Progress Notes (Signed)
[facility] MD Progress Note  12/28/2011 12:43 PM  Diagnosis:  Axis I: Substance Abuse and Substance Induced Mood Disorder Axis II: Borderline Personality Dis.  ADL's:  Intact  Sleep: Poor  Appetite:  Fair  Suicidal Ideation:  Pt still has fleeting suicidal/self harm thoughts, but contracts for safety. Homicidal Ideation:  Denies adamantly any homicidal thoughts.  Mental Status Examination/Evaluation: Objective:  Appearance: Casual  Eye Contact::  Good  Speech:  Clear and Coherent  Volume:  Normal  Mood:  Anxious and Depressed  Affect:  Congruent  Thought Process:  Coherent  Orientation:  Full  Thought Content:  WDL  Suicidal Thoughts:  Yes.  without intent/plan  Homicidal Thoughts:  No  Memory:  Immediate;   Fair  Judgement:  Fair  Insight:  Fair  Psychomotor Activity:  Normal  Concentration:  Fair  Recall:  Fair  Akathisia:  No  Handed:  Right  AIMS (if indicated):     Assets:  Communication Skills Desire for Improvement  Sleep:  Number of Hours: 6.75    Vital Signs:Blood pressure 112/64, pulse 64, temperature 97.3 F (36.3 C), temperature source Oral, resp. rate 15, height 5\' 9"  (1.753 m), weight 78.019 kg (172 lb). Current Medications: Current Facility-Administered Medications  Medication Dose Route Frequency Provider Last Rate Last Dose  . acetaminophen (TYLENOL) tablet 650 mg  650 mg Oral Q6H PRN Mike Craze, MD      . alum & mag hydroxide-simeth (MAALOX/MYLANTA) 200-200-20 MG/5ML suspension 30 mL  30 mL Oral Q4H PRN Mike Craze, MD      . chlordiazePOXIDE (LIBRIUM) capsule 25 mg  25 mg Oral Q6H PRN Mike Craze, MD      . citalopram (CELEXA) tablet 40 mg  40 mg Oral Daily Sanjuana Kava, NP   40 mg at 12/29/11 0837  . magnesium hydroxide (MILK OF MAGNESIA) suspension 30 mL  30 mL Oral Daily PRN Mike Craze, MD      . nicotine (NICODERM CQ - dosed in mg/24 hours) patch 21 mg  21 mg Transdermal Q0600 Mike Craze, MD   21 mg at 12/29/11 0645  . topiramate  (TOPAMAX) tablet 100 mg  100 mg Oral Daily Sanjuana Kava, NP   100 mg at 12/29/11 0837  . topiramate (TOPAMAX) tablet 50 mg  50 mg Oral Q2000 Sanjuana Kava, NP   50 mg at 12/28/11 2016    Lab Results: No results found for this or any previous visit (from the past 48 hour(s)).  Physical Findings: AIMS:  , ,  ,  ,    CIWA:  CIWA-Ar Total: 2  COWS:     Treatment Plan Summary: Daily contact with patient to assess and evaluate symptoms and progress in treatment Medication management Mood and anxiety are below 3/10 on the scale of 1 is the best and 10 is the worst, no suicidal thoughts for 48 hours.  Plan: Pt seen in Treatment Team today.  He described having some fleeting thoughts of self harm.  He is trying to manage them with his meditation.  Discussed with him more about the placement at the [facility].  He is very interested in that.  Dewaun Kinzler 12/28/2011, 12:43 PM

## 2011-12-29 NOTE — Progress Notes (Signed)
Patient ID: Derek Weaver, male   DOB: 09-Apr-1961, 51 y.o.   MRN: [id number] Pt. Denies lethality and A/V/H's or other problems tonight: Pt.'s affect remains blank to blunted, but is seen interacting with peers in the hallway and is appropriate with staff. Pt took his meds and attended groups.

## 2011-12-29 NOTE — Progress Notes (Signed)
[facility] Group Notes:  (Counselor/Nursing/MHT/Case Management/Adjunct)  12/29/2011 11:00AM Music As A Relapse Prevention Tool   Type of Therapy:  Group Therapy  Participation Level: Limited  Participation Quality:  Attentive  Affect:  Blunted  Cognitive:  Appropriate  Insight: None  Engagement in Group:  Limited  Engagement in Therapy:  Minimal  Modes of Intervention:  Support and Exploration  Summary of Progress/Problems: Various songs were played that used different styles and perspectives to express empowerment over a difficult situation. Derek Weaver was not very engaged in the group process.   Derek Weaver 12/29/2011  2:33 PM

## 2011-12-30 NOTE — Progress Notes (Signed)
  Derek Weaver is a 51 y.o. male [id number] 01-26-1961  12/26/2011 Active Problems:  Borderline behavior  Cannabis abuse, uncomplicated  Psychoactive substance-induced organic mood disorder  Alcohol abuse   Mental Status: Mood is still quite entitled. Has thoughts to cut-but always does. Denies SI/HI/AVH.    Subjective/Objective: Remains anxious about his future.Is to call a program that should last a year on Monday.    Filed Vitals:   12/30/11 0701  BP: 103/69  Pulse: 73  Temp:   Resp:     Lab Results:   BMET    Component Value Date/Time   NA 142 12/26/2011 0022   K 3.6 12/26/2011 0022   CL 104 12/26/2011 0022   CO2 25 12/26/2011 0022   GLUCOSE 92 12/26/2011 0022   BUN 12 12/26/2011 0022   CREATININE 0.93 12/26/2011 0022   CALCIUM 9.2 12/26/2011 0022   GFRNONAA >90 12/26/2011 0022   GFRAA >90 12/26/2011 0022    Medications:  Scheduled:     . citalopram  40 mg Oral Daily  . nicotine  21 mg Transdermal Q0600  . topiramate  100 mg Oral Daily  . topiramate  50 mg Oral Q2000     PRN Meds acetaminophen, alum & mag hydroxide-simeth, chlordiazePOXIDE, magnesium hydroxide  Plan: continue current plan of care.  Derek Weaver,MICKIE D. 12/30/2011

## 2011-12-30 NOTE — Progress Notes (Signed)
Pt attending the program on the 300 hall. Mood and affect are appropriate. Rates his depression at a 2 and his hopelessness at a 1. States that he has thought of SI within the last 48 hours. Given support reassurance and praise.

## 2011-12-30 NOTE — Progress Notes (Signed)
[facility] Group Notes:  (Counselor/Nursing/MHT/Case Management/Adjunct)  12/30/2011 6:10 PM  Type of Therapy:  After Care Planning group  Pt. participated  In after care planning group  And was given [place] suicide prevention pamphlet and was given crisis hot line numbers. Pt. Agreed to use them if needed. Pt. Sated he came to [facility] and that he has a problem with cutting. The pt. Saw Dr. [name] on Friday and states he is having SI on and off. Pt. contracted for safety. Pt. Had no other questions or concerns.  Lamar Blinks [place] 12/30/2011, 6:10 PM

## 2011-12-30 NOTE — Progress Notes (Signed)
[facility] Group Notes:  (Counselor/Nursing/MHT/Case Management/Adjunct)  12/30/2011 6:43 PM  Type of Therapy:  Psychoeducational Skills  Participation Level:  Minimal  Participation Quality:  Appropriate and Attentive  Affect:  Appropriate and Blunted  Cognitive:  Alert, Appropriate and Oriented  Insight:  Good  Engagement in Group:  Good  Engagement in Therapy:  n/a  Modes of Intervention:  Activity, Education, Problem-solving, Socialization and Support  Summary of Progress/Problems: Rhoderick participated in game that asked fun questions and self and peers. Ikeem participated in group discussion about how quality time with support people can strengthen support relationship. Group outlined ways they have sabotaged their support relationships in the past and how they can use them in a positive way in the future. Kiet was given a homework assignment and handouts on how to strengthen support systems.    Wandra Scot 12/30/2011, 6:43 PM

## 2011-12-30 NOTE — Progress Notes (Signed)
[facility] Group Notes:  (Counselor/Nursing/MHT/Case Management/Adjunct)  12/30/2011 7:04 PM Type of Therapy:  Group Therapy  Participation Level:  Did Not Attend   Neila Gear 12/30/2011, 7:04 PM

## 2011-12-31 NOTE — Progress Notes (Signed)
[facility] Group Notes:  (Counselor/Nursing/MHT/Case Management/Adjunct)  12/31/2011 5:31 PM  Type of Therapy:  After Care Planning Group  Pt. participated din after care planning group and was given [place] SI Pamphlet and crisis and hot line numbers. Pt. Agreed to use them if needed. Patients in the group were also given information about support groups in [employer].  And information about the [employer].  Pt. Spoke his d/c and going to [facility]. Pt. States he slept okay and denies any SI /Hi. Pt. Had no other questions or concerns but did ask about support groups in [place].  [place], Melissa 12/31/2011, 5:31 PM

## 2011-12-31 NOTE — Progress Notes (Signed)
[facility] Group Notes:  (Counselor/Nursing/MHT/Case Management/Adjunct)  12/31/2011 6:18 PM  Type of Therapy:  Group Therapy  Participation Level:  Did Not Attend    Neila Gear 12/31/2011, 6:18 PM

## 2011-12-31 NOTE — Progress Notes (Signed)
Pt attended the groups today and participated. States the he is having some SI but contracts for safety. Rates his depression at a 2 and his hopelessness at a 0. Given support and reassurance throughout the day.

## 2011-12-31 NOTE — Progress Notes (Signed)
  Derek Weaver is a 51 y.o. male [id number] 11/19/1960  12/26/2011 Active Problems:  Borderline behavior  Cannabis abuse, uncomplicated  Psychoactive substance-induced organic mood disorder  Alcohol abuse   Mental Status: mood is his usual denies active SI/HI/AVH.    Subjective/Objective: Says meds are ok and he slept well. Is his usual entitled with poor insight self.     Filed Vitals:   12/31/11 0816  BP: 106/78  Pulse: 68  Temp:   Resp:     Lab Results:   BMET    Component Value Date/Time   NA 142 12/26/2011 0022   K 3.6 12/26/2011 0022   CL 104 12/26/2011 0022   CO2 25 12/26/2011 0022   GLUCOSE 92 12/26/2011 0022   BUN 12 12/26/2011 0022   CREATININE 0.93 12/26/2011 0022   CALCIUM 9.2 12/26/2011 0022   GFRNONAA >90 12/26/2011 0022   GFRAA >90 12/26/2011 0022    Medications:  Scheduled:     . citalopram  40 mg Oral Daily  . nicotine  21 mg Transdermal Q0600  . topiramate  100 mg Oral Daily  . topiramate  50 mg Oral Q2000     PRN Meds acetaminophen, alum & mag hydroxide-simeth, chlordiazePOXIDE, magnesium hydroxide  PLAN: continue current plan of care -should find out about placement tomorrow. Susanne Baumgarner,MICKIE D. 12/31/2011
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
## 2011-12-31 NOTE — Progress Notes (Signed)
Patient came to medication window and received his scheduled 2000 medication, voiced no complaints. Patient has been in the dayroom interacting appropriately with select peers. Passive si off and on verbally contracts, denies pain, hi, auditory/visual hallucinations. Safety maintained on unit, will continue to monitor.

## 2012-01-01 MED ORDER — DOCUSATE SODIUM 100 MG PO CAPS
100.0000 mg | ORAL_CAPSULE | Freq: Two times a day (BID) | ORAL | Status: AC | PRN
Start: 2012-01-03 — End: 2012-01-05
  Administered 2012-01-03 – 2012-01-04 (×2): 100 mg via ORAL
  Filled 2012-01-01 (×2): qty 1

## 2012-01-01 MED ORDER — DOCUSATE SODIUM 100 MG PO CAPS
100.0000 mg | ORAL_CAPSULE | Freq: Two times a day (BID) | ORAL | Status: AC
  Administered 2012-01-01 – 2012-01-03 (×4): 100 mg via ORAL
  Filled 2012-01-01 (×5): qty 1

## 2012-01-01 MED ORDER — BUPROPION HCL ER (SR) 100 MG PO TB12
100.0000 mg | ORAL_TABLET | Freq: Two times a day (BID) | ORAL | Status: DC
  Administered 2012-01-01 – 2012-01-04 (×6): 100 mg via ORAL
  Filled 2012-01-01 (×11): qty 1

## 2012-01-01 NOTE — Progress Notes (Signed)
[facility] MD Progress Note  01/01/2012 6:24 PM  Diagnosis:  Axis I: Substance Abuse and Substance Induced Mood Disorder Axis II: Borderline Personality Dis.  ADL's:  Intact  Sleep: Good, getting and staying asleep is good  Appetite:  Good  Suicidal Ideation:  Pt had some fleeting suicidal/self harm thoughts today with his depression being up again.  He had had none for 2 days. Homicidal Ideation:  Denies adamantly any homicidal thoughts.  Mental Status Examination/Evaluation: Objective:  Appearance: Casual  Eye Contact::  Good  Speech:  Clear and Coherent  Volume:  Normal  Mood:  Depressed, this is slightly worse.  Affect:  Congruent  Thought Process:  Coherent  Orientation:  Full  Thought Content:  WDL  Suicidal Thoughts:  Yes.  without intent/plan  Homicidal Thoughts:  No  Memory:  Immediate;   Fair  Judgement:  Fair  Insight:  Fair  Psychomotor Activity:  Normal  Concentration:  Fair, he complains of this being bad. The Wellbutrin may help that again.  Recall:  Fair  Akathisia:  No  Handed:  Right  AIMS (if indicated):     Assets:  Communication Skills Desire for Improvement  Sleep:  Number of Hours: 5.75    ROS: Neuro: none today headaches, ataxia, weakness  GI: none today N/V/D/cramps, but some slight constipation.  Will prescribe Colace for this.  MS: none today weakness, muscle cramps, aches.  Vital Signs:Blood pressure 104/66, pulse 74, temperature 97.8 F (36.6 C), temperature source Oral, resp. rate 18, height 5\' 9"  (1.753 m), weight 78.019 kg (172 lb). Current Medications: Current Facility-Administered Medications  Medication Dose Route Frequency Provider Last Rate Last Dose  . acetaminophen (TYLENOL) tablet 650 mg  650 mg Oral Q6H PRN Mike Craze, MD      . alum & mag hydroxide-simeth (MAALOX/MYLANTA) 200-200-20 MG/5ML suspension 30 mL  30 mL Oral Q4H PRN Mike Craze, MD      . buPROPion [facility] SR) 12 hr tablet 100 mg  100 mg Oral BID Mike Craze, MD      . chlordiazePOXIDE (LIBRIUM) capsule 25 mg  25 mg Oral Q6H PRN Mike Craze, MD      . citalopram (CELEXA) tablet 40 mg  40 mg Oral Daily Sanjuana Kava, NP   40 mg at 01/01/12 0834  . docusate sodium (COLACE) capsule 100 mg  100 mg Oral BID Mike Craze, MD       Followed by  . docusate sodium (COLACE) capsule 100 mg  100 mg Oral BID PRN Mike Craze, MD      . magnesium hydroxide (MILK OF MAGNESIA) suspension 30 mL  30 mL Oral Daily PRN Mike Craze, MD      . nicotine (NICODERM CQ - dosed in mg/24 hours) patch 21 mg  21 mg Transdermal Q0600 Mike Craze, MD   21 mg at 01/01/12 1610  . topiramate (TOPAMAX) tablet 100 mg  100 mg Oral Daily Sanjuana Kava, NP   100 mg at 01/01/12 0835  . topiramate (TOPAMAX) tablet 50 mg  50 mg Oral Q2000 Sanjuana Kava, NP   50 mg at 12/31/11 1938    Lab Results: No results found for this or any previous visit (from the past 48 hour(s)).  Physical Findings: AIMS:  , ,  ,  ,    CIWA:  CIWA-Ar Total: 2  COWS:     Treatment Plan Summary: Daily contact with patient to assess and evaluate  symptoms and progress in treatment Medication management Mood and anxiety are below 3/10 on the scale of 1 is the best and 10 is the worst, no suicidal thoughts for 48 hours.  Plan: Pt seen in consult room today. He has a more negative scowl on his face.  He says that his depression is worse today.  Will add back the Wellbutrin that had helped his mood and concentration before. Will add Colace for his constipation.  [name], Dalayla [place] 01/01/2012 6:24 PM

## 2012-01-01 NOTE — Tx Team (Signed)
Interdisciplinary Treatment Plan Update (Adult)  Date:  01/01/2012  Time Reviewed:  11:03 AM   Progress in Treatment: Attending groups:   Yes   Participating in groups:  Yes Taking medication as prescribed:  Yes Tolerating medication:  Yes Family/Significant othe contact made:  Patient understands diagnosis:  Yes Discussing patient identified problems/goals with staff: Yes Medical problems stabilized or resolved: Yes Denies suicidal/homicidal ideation:Yes Issues/concerns per patient self-inventory:  Other:  New problem(s) identified:  Reason for Continuation of Hospitalization: Depression Medication stabilization Other; describe Off/On thoughts of self-harm  Interventions implemented related to continuation of hospitalization:  Medication Management; safety checks q 15 mins  Additional comments:  Estimated length of stay: 2-3 days  Discharge Plan: Patient plans to relocate to [facility] with follow up there  New goal(s):  Review of initial/current patient goals per problem list:   1.  Goal(s):  Eliminate SI/other thoughts of self harm   Met:  No - Patient continues to endorse off/on thoughts of self harm  Target date: d/c  As evidenced by: Patient will no longer endorse SI/other thought self harm  2.  Goal (s): Reduce depression/anxiety (currently rates depression at five and anxiety at zero)   Met:  No/yes  Target date: d/c  As evidenced by: Patient currently rating symptoms at four or below  3.  Goal(s): .stabilize on meds   Met:  No  Target date:d/c  As evidenced by: .Patient will report being stable on medications - symptoms have decreased  4.  Goal(s): Refer for outpatient follow up   Met:  No  Target date: d/c  As evidenced by: Follow up appointment will be scheduled  Attendees: Patient:     Other:  Corinne Ports, PhD Candidate 01/01/2012 11:09 AM  Physician:  Orson Aloe, MD 01/01/2012 11:03 AM   Nursing:   Faythe Dingwall, RN 01/01/2012 11:03 AM     CaseManager:  Juline Patch, LCSW 01/01/2012 11:03 AM   Counselor:  Angus Palms, LCSW 01/01/2012 11:03 AM    Scribe for Treatment Team:   Wynn Banker, LCSW,  01/01/2012 11:03 AM

## 2012-01-01 NOTE — [facility] Counselor (Addendum)
Group Therapy Note  Date:  01/01/2012 Time:  11:00  Group Topic/Focus: Overcoming Obstacles to  wellness  Participation Level:  Minimal  Participation Quality:  Appropriate  Affect:  Appropriate  Cognitive:  Appropriate  Insight:  Limited  Engagement in Group:  Limited  Additional Comments:  Derek Weaver made some contributions to the group but mainly listened while others spoke. He did report that he wants to achieve wellness and get better.   Christy Sartorius. 01/01/2012, 12:23 PM     [facility] Group Notes: (Counselor/Nursing/MHT/Case Management/Adjunct) 01/01/2012   @1 :15pm Mindfulness Meditation for Self-Acceptance  Type of Therapy:  Group Therapy  Participation Level:  Limited  Participation Quality:  Attentive   Affect:  Blunted  Cognitive:  Appropriate  Insight:  Good  Engagement in Group: Good  Engagement in Therapy:  Limited  Modes of Intervention:  Support and Exploration  Summary of Progress/Problems: Derek Weaver was very engaged in mindfulness meditation. He was very attentive and stated that he felt less anxious after the activity.  [name] 01/01/2012 3:11 PM

## 2012-01-01 NOTE — Progress Notes (Signed)
Patient ID: Derek Weaver, male   DOB: 1961/05/03, 51 y.o.   MRN: [id number] Pt. Reports depression at "5" of 10. Pt. Plans to go to [facility] [place]. Pt. Denies SHI. Pt. Played piano this evening and also attended group on [address] hall. Pt. Interacts well with other clients. Staff will monitor q42min for safety.

## 2012-01-01 NOTE — Progress Notes (Signed)
Patient has been up in the dayroom watching tv and interacting with select peers. Patient was given 2000 med. Reports si off and on, denies having pain. - auditory/ visual hall. Will continue to monitor.

## 2012-01-01 NOTE — Progress Notes (Signed)
Slept well last nite, appetite is good, energy level is low and ability to pay attention is imporving, depression 5/10 and hopeless 0/10, states he is Si off and on but can contract for safety. Eats meals in the DR, takes meds as ordered by MD, attending group and interacting wpeers in dayroom. q39min safety checks continue and support offered Safety maintained

## 2012-01-01 NOTE — Progress Notes (Signed)
Patient seen during d/c planning group.  He advised of being pretty good today but "bouncing around" in his thoughts.  He denies SI but endorses off/on thoughts of self-harm.  He rates depression at five and rates anxiety at zero.

## 2012-01-02 NOTE — Progress Notes (Signed)
Patient ID: Derek Weaver, male   DOB: June 22, 1961, 51 y.o.   MRN: [id number]   Patient has a flat affect on approach today. States depression "7" and feelings of hopelessness "0". Still reports not feeling much better since admitted. Passive SI on and off but no plan. Staff will monitor and encourage group attendance.

## 2012-01-02 NOTE — Progress Notes (Signed)
[facility] MD Progress Note  01/02/2012 2:31 PM  Diagnosis:  Axis I: Substance Abuse and Substance Induced Mood Disorder Axis II: Borderline Personality Dis.  ADL's:  Intact  Sleep: Good, getting and staying asleep is good  Appetite:  Good  Suicidal Ideation:  Pt denied any suicidal/self harm thoughts today. Homicidal Ideation:  Denies adamantly any homicidal thoughts.  Mental Status Examination/Evaluation: Objective:  Appearance: Casual  Eye Contact::  Good  Speech:  Clear and Coherent  Volume:  Normal  Mood:  Depressed, this seems even worse than yesterday even with the addition of Wellbutrin.  He has been doing his meditation to help control his anxiety with some success.  He feels that is why he doesn't feel anxiety.  Affect:  Congruent  Thought Process:  Coherent  Orientation:  Full  Thought Content:  WDL  Suicidal Thoughts:  No  Homicidal Thoughts:  No  Memory:  Immediate;   Fair  Judgement:  Fair  Insight:  Fair  Psychomotor Activity:  Normal  Concentration:  Fair, no change here yet.  Recall:  Fair  Akathisia:  No  Handed:  Right  AIMS (if indicated):     Assets:  Communication Skills Desire for Improvement  Sleep:  Number of Hours: 6.75    ROS: Neuro: denies headaches, ataxia, weakness  GI: denies N/V/D/cramps, but some slight constipation.  No response to Colace yet.  MS: denies weakness, muscle cramps, aches.  Vital Signs:Blood pressure 94/64, pulse 74, temperature 97.9 F (36.6 C), temperature source Oral, resp. rate 16, height 5\' 9"  (1.753 m), weight 78.019 kg (172 lb). Current Medications: Current Facility-Administered Medications  Medication Dose Route Frequency Provider Last Rate Last Dose  . acetaminophen (TYLENOL) tablet 650 mg  650 mg Oral Q6H PRN Mike Craze, MD      . alum & mag hydroxide-simeth (MAALOX/MYLANTA) 200-200-20 MG/5ML suspension 30 mL  30 mL Oral Q4H PRN Mike Craze, MD      . buPROPion [facility] SR) 12 hr tablet 100 mg  100 mg  Oral BID Mike Craze, MD   100 mg at 01/02/12 0755  . chlordiazePOXIDE (LIBRIUM) capsule 25 mg  25 mg Oral Q6H PRN Mike Craze, MD      . citalopram (CELEXA) tablet 40 mg  40 mg Oral Daily Sanjuana Kava, NP   40 mg at 01/02/12 0755  . docusate sodium (COLACE) capsule 100 mg  100 mg Oral BID Mike Craze, MD   100 mg at 01/02/12 0755   Followed by  . docusate sodium (COLACE) capsule 100 mg  100 mg Oral BID PRN Mike Craze, MD      . magnesium hydroxide (MILK OF MAGNESIA) suspension 30 mL  30 mL Oral Daily PRN Mike Craze, MD      . nicotine (NICODERM CQ - dosed in mg/24 hours) patch 21 mg  21 mg Transdermal Q0600 Mike Craze, MD   21 mg at 01/02/12 0653  . topiramate (TOPAMAX) tablet 100 mg  100 mg Oral Daily Sanjuana Kava, NP   100 mg at 01/02/12 0756  . topiramate (TOPAMAX) tablet 50 mg  50 mg Oral Q2000 Sanjuana Kava, NP   50 mg at 01/01/12 1958    Lab Results: No results found for this or any previous visit (from the past 48 hour(s)).  Physical Findings: AIMS:  , ,  ,  ,    CIWA:  CIWA-Ar Total: 2  COWS:     Treatment  Plan Summary: Daily contact with patient to assess and evaluate symptoms and progress in treatment Medication management Mood and anxiety are below 3/10 on the scale of 1 is the best and 10 is the worst, no suicidal thoughts for 48 hours.  Plan: Pt seen in consult room today. He still has a negative scowl on his face.  He that his anxiety may be under control with him being able to do his meditation.  His depression is worse today.  He is on the Wellbutrin that had helped his mood and concentration before. Colace for his constipation has not worked yet.  [name], Kejon Feild 01/02/2012 2:31 PM

## 2012-01-02 NOTE — Progress Notes (Signed)
01/02/2012   @ 11:00 Feelings About Diagnosis  Type of Therapy:  Group Therapy  Participation Level:  None  Participation Quality: Attentive   Affect:  Blunted  Cognitive:  Appropriate  Insight:  None  Engagement in Group: None  Engagement in Therapy:  None  Modes of Intervention:  Support and Exploration  Summary of Progress/Problems: Derek Weaver  was attentive but not engaged in group process     [name] 01/02/2012  2:13 PM

## 2012-01-03 MED ORDER — RISPERIDONE 0.25 MG PO TABS
0.2500 mg | ORAL_TABLET | Freq: Two times a day (BID) | ORAL | Status: DC
  Administered 2012-01-04 – 2012-01-08 (×9): 0.25 mg via ORAL
  Filled 2012-01-03 (×4): qty 1
  Filled 2012-01-03: qty 28
  Filled 2012-01-03 (×9): qty 1
  Filled 2012-01-03: qty 28
  Filled 2012-01-03: qty 1

## 2012-01-03 NOTE — Progress Notes (Signed)
Patient seen during d/c planning group.  He reports "bouncing around" today and having thoughts of cutting. He denies SI but reports he had thought about having a bottle of sleeping pills, ETOH and a blanket hide in the wood that he was planning to use in a suicide attempt.  Patient shared that he is concerned that he returned to [facility] and could use the things hidden in a suicide attempt.  Patient able to contract for safety.

## 2012-01-03 NOTE — Progress Notes (Signed)
Patient ID: Derek Weaver, male   DOB: Dec 25, 1960, 51 y.o.   MRN: [id number] Pt spoke out in group this evening stating that he wanted to make sure people knew he could speak for discharge. In group pt acknowledged that he usually keeps to himself.

## 2012-01-03 NOTE — Progress Notes (Signed)
Pt has been calm and cooperative this evening. Pt attends groups and interacts approprietly within the milieu. Pt reports some anxiety. Risperdal has been newly prescribed for this pt upon meeting with attending physician. Pt anxiety has been controlled with no further interventions needed. Pt is denying any SI at this time. Continued support and availability as needed has been extended to this pt. Pt safety remains with q5min checks.

## 2012-01-03 NOTE — Progress Notes (Signed)
Derek Weaver is sad, depressed and shares that he was having thoughts of cutting himself earlier this morning. HE says he is worried about being discharged today...that he doesn't feel ready. That he thinks that is what is going to happen today . He looks down frequently, avoiding eye contact. He is flat, sad and has a blunted affect. HE completed his self inventory and on it he wrote he was having " off and on SI", he rated his depression and hopelessness " 6 / 0 " and stated his DC plan includes to " continue to work on treatment". Therapeutic relationship is intact. Pt is supported and safety is maintaiend and POC cont PD RN [facility]

## 2012-01-03 NOTE — Progress Notes (Signed)
[facility] MD Progress Note  01/03/2012 5:38 PM  Diagnosis:  Axis I: Substance Abuse and Substance Induced Mood Disorder Axis II: Borderline Personality Dis.  ADL's:  Intact  Sleep: Fair, not so good last night.  Appetite:  Good  Suicidal Ideation:  Pt had some suicidal/self harm thoughts today. He wonders about the low dose Risperdal that he has been on.  He is agreeable to restarting this.  He reports that the self harm thoughts are manageable Homicidal Ideation:  Denies adamantly any homicidal thoughts.  Mental Status Examination/Evaluation: Objective:  Appearance: Casual  Eye Contact::  Good  Speech:  Clear and Coherent  Volume:  Normal  Mood:  Depressed, this could be due to his not being on the very low does Risperdal.  He agrees to restart.  Affect:  Congruent  Thought Process:  Coherent  Orientation:  Full  Thought Content:  WDL  Suicidal Thoughts:  No  Homicidal Thoughts:  No  Memory:  Immediate;   Fair  Judgement:  Fair  Insight:  Fair  Psychomotor Activity:  Normal  Concentration:  Fair, no change here yet.  Recall:  Fair  Akathisia:  No  Handed:  Right  AIMS (if indicated):     Assets:  Communication Skills Desire for Improvement  Sleep:  Number of Hours: 6.75    ROS: Neuro: today no headaches, ataxia, weakness  GI: today no N/V/D/cramps, but some slight constipation.  Still no response to Colace yet.  MS: today no weakness, muscle cramps, aches.  Vital Signs:Blood pressure 116/76, pulse 75, temperature 97.2 F (36.2 C), temperature source Oral, resp. rate 14, height 5\' 9"  (1.753 m), weight 78.019 kg (172 lb). Current Medications: Current Facility-Administered Medications  Medication Dose Route Frequency Provider Last Rate Last Dose  . acetaminophen (TYLENOL) tablet 650 mg  650 mg Oral Q6H PRN Mike Craze, MD      . alum & mag hydroxide-simeth (MAALOX/MYLANTA) 200-200-20 MG/5ML suspension 30 mL  30 mL Oral Q4H PRN Mike Craze, MD      . buPROPion  [facility] SR) 12 hr tablet 100 mg  100 mg Oral BID Mike Craze, MD   100 mg at 01/03/12 1711  . chlordiazePOXIDE (LIBRIUM) capsule 25 mg  25 mg Oral Q6H PRN Mike Craze, MD      . citalopram (CELEXA) tablet 40 mg  40 mg Oral Daily Sanjuana Kava, NP   40 mg at 01/03/12 0831  . docusate sodium (COLACE) capsule 100 mg  100 mg Oral BID Mike Craze, MD   100 mg at 01/03/12 0831   Followed by  . docusate sodium (COLACE) capsule 100 mg  100 mg Oral BID PRN Mike Craze, MD      . magnesium hydroxide (MILK OF MAGNESIA) suspension 30 mL  30 mL Oral Daily PRN Mike Craze, MD      . nicotine (NICODERM CQ - dosed in mg/24 hours) patch 21 mg  21 mg Transdermal Q0600 Mike Craze, MD   21 mg at 01/03/12 0620  . risperiDONE (RISPERDAL) tablet 0.25 mg  0.25 mg Oral BID Mike Craze, MD      . topiramate (TOPAMAX) tablet 100 mg  100 mg Oral Daily Sanjuana Kava, NP   100 mg at 01/03/12 4098  . topiramate (TOPAMAX) tablet 50 mg  50 mg Oral Q2000 Sanjuana Kava, NP   50 mg at 01/02/12 2002    Lab Results: No results found for this or  any previous visit (from the past 48 hour(s)).  Physical Findings: AIMS:  , ,  ,  ,    CIWA:  CIWA-Ar Total: 2  COWS:     Treatment Plan Summary: Daily contact with patient to assess and evaluate symptoms and progress in treatment Medication management Mood and anxiety are below 3/10 on the scale of 1 is the best and 10 is the worst, no suicidal thoughts for 48 hours.  Plan: Pt seen in his hospital room today. He still has a negative scowl on his face. His anxiety and depression is not better on the Wellbutrin.  He asks about the Risperdal that he was on before.  Apparently at [facility] they noted that the dose was too low to be doing anything.  He was agreeable to restarting this.  Researched it and he had been on 0.25 mg BID. Will restart that. Colace for his constipation has not worked yet.  [name], Matthew Pais 01/03/2012 5:38 PM

## 2012-01-03 NOTE — Progress Notes (Signed)
[facility] Group Notes: (Counselor/Nursing/MHT/Case Management/Adjunct) 01/03/2012    11:00am Emotion Regulation  Type of Therapy:  Group Therapy  Participation Level:  None  Participation Quality: Attentive   Affect:  Blunted  Cognitive:  Appropriate  Insight:  None  Engagement in Group: minimal  Engagement in Therapy:  Minimal  Modes of Intervention:  Support and Exploration  Summary of Progress/Problems: Derek Weaver was present and attentive, but did not engage much in group.  Derek Weaver 01/03/2012  4:08 PM       [facility] Group Notes: (Counselor/Nursing/MHT/Case Management/Adjunct) 01/03/2012   @1 :15pm Developing Need-based Coping Skills   Type of Therapy:  Group Therapy  Participation Level:  Limited  Participation Quality: Attentive, Sharing    Affect:  Blunted  Cognitive:  Appropriate  Insight:  Minimal  Engagement in Group: Minimal  Engagement in Therapy:  None  Modes of Intervention:  Support and Exploration  Summary of Progress/Problems: Derek Weaver was very quiet throughout group. He identified that his drug use was a coping skill for his deep feelings of sadness, and that his cutting behaviors help him meet the need of having a release. He seemed to relate to others who shared about wanting a feeling of control.   Derek Weaver 01/03/2012 4:09 PM

## 2012-01-03 NOTE — Progress Notes (Signed)
01/03/2012         Time: 1430      Group Topic/Focus: The focus of this group is on enhancing the patient's understanding of leisure, barriers to leisure, and the importance of engaging in positive leisure activities upon discharge for improved total health.   Participation Level: Minimal  Participation Quality: Attentive  Affect: Labile  Cognitive: Oriented  Additional Comments: Patient flat, isolative unless making jokes towards RT or peers. Mood quickly fluctuating.  Royal Vandevoort 01/03/2012 3:24 PM

## 2012-01-04 DIAGNOSIS — F329 Major depressive disorder, single episode, unspecified: Secondary | ICD-10-CM | POA: Diagnosis present

## 2012-01-04 MED ORDER — BUPROPION HCL ER (SR) 150 MG PO TB12
150.0000 mg | ORAL_TABLET | Freq: Two times a day (BID) | ORAL | Status: DC
  Administered 2012-01-04 – 2012-01-08 (×8): 150 mg via ORAL
  Filled 2012-01-04 (×8): qty 1
  Filled 2012-01-04 (×2): qty 28
  Filled 2012-01-04 (×2): qty 1

## 2012-01-04 MED ORDER — CITALOPRAM HYDROBROMIDE 20 MG PO TABS
20.0000 mg | ORAL_TABLET | Freq: Every day | ORAL | Status: DC
  Administered 2012-01-05 – 2012-01-08 (×4): 20 mg via ORAL
  Filled 2012-01-04 (×5): qty 1

## 2012-01-04 NOTE — Progress Notes (Signed)
[facility] MD Progress Note  01/04/2012 2:45 PM  Diagnosis:  Axis I: Alcohol Abuse, Major Depression, Recurrent severe, Substance Abuse and Substance Induced Mood Disorder Axis II: Borderline Personality Dis.  ADL's:  Intact  Sleep: Fair, a little better last night.  Appetite:  Good  Suicidal Ideation:  Pt had some suicidal/self harm thoughts today.  Homicidal Ideation:  Denies adamantly any homicidal thoughts.  Mental Status Examination/Evaluation: Objective:  Appearance: Casual  Eye Contact::  Good  Speech:  Clear and Coherent  Volume:  Normal  Mood:  Depressed, reports that he needs to get into talk therapy because that has helped in the past and that is how he feels now, like that might help.  Will increase Wellbutrin and back off the Celexa.  Affect:  Congruent  Thought Process:  Coherent  Orientation:  Full  Thought Content:  WDL  Suicidal Thoughts:  No  Homicidal Thoughts:  No  Memory:  Immediate;   Fair  Judgement:  Fair  Insight:  Fair  Psychomotor Activity:  Normal  Concentration:  Fair, a little bit better  Recall:  Fair  Akathisia:  No  Handed:  Right  AIMS (if indicated):     Assets:  Communication Skills Desire for Improvement  Sleep:  Number of Hours: 6.25    ROS: Neuro: had headache this AM, but no, ataxia, weakness  GI: denies N/V/D/cramps, but still slight constipation.    MS: denies weakness, muscle cramps, aches.  Vital Signs:Blood pressure 105/70, pulse 80, temperature 97.8 F (36.6 C), temperature source Oral, resp. rate 18, height 5\' 9"  (1.753 m), weight 78.019 kg (172 lb). Current Medications: Current Facility-Administered Medications  Medication Dose Route Frequency Provider Last Rate Last Dose  . acetaminophen (TYLENOL) tablet 650 mg  650 mg Oral Q6H PRN Derek Craze, MD      . alum & mag hydroxide-simeth (MAALOX/MYLANTA) 200-200-20 MG/5ML suspension 30 mL  30 mL Oral Q4H PRN Derek Craze, MD      . buPROPion [facility] SR) 12 hr tablet  150 mg  150 mg Oral BID Derek Craze, MD      . chlordiazePOXIDE (LIBRIUM) capsule 25 mg  25 mg Oral Q6H PRN Derek Craze, MD      . citalopram (CELEXA) tablet 20 mg  20 mg Oral Daily Derek Craze, MD      . docusate sodium (COLACE) capsule 100 mg  100 mg Oral BID PRN Derek Craze, MD   100 mg at 01/03/12 1844  . magnesium hydroxide (MILK OF MAGNESIA) suspension 30 mL  30 mL Oral Daily PRN Derek Craze, MD      . nicotine (NICODERM CQ - dosed in mg/24 hours) patch 21 mg  21 mg Transdermal Q0600 Derek Craze, MD   21 mg at 01/04/12 0607  . risperiDONE (RISPERDAL) tablet 0.25 mg  0.25 mg Oral BID Derek Craze, MD   0.25 mg at 01/04/12 0831  . topiramate (TOPAMAX) tablet 100 mg  100 mg Oral Daily Derek Kava, NP   100 mg at 01/04/12 1610  . topiramate (TOPAMAX) tablet 50 mg  50 mg Oral Q2000 Derek Kava, NP   50 mg at 01/03/12 1955  . DISCONTD: buPROPion (WELLBUTRIN SR) 12 hr tablet 100 mg  100 mg Oral BID Derek Craze, MD   100 mg at 01/04/12 0831  . DISCONTD: citalopram (CELEXA) tablet 40 mg  40 mg Oral Daily Derek Kava, NP   40 mg  at 01/04/12 0831    Lab Results: No results found for this or any previous visit (from the past 48 hour(s)).  Physical Findings: AIMS:  , ,  ,  ,    CIWA:  CIWA-Ar Total: 2  COWS:     Treatment Plan Summary: Daily contact with patient to assess and evaluate symptoms and progress in treatment Medication management Mood and anxiety are below 3/10 on the scale of 1 is the best and 10 is the worst, no suicidal thoughts for 48 hours.  Plan: Pt seen in consult room today. Depression persists.  Will increase Wellbutrin and back off the Celexa some.  It may not be working anymore. He is encouraged to keep taking the Colace everyday until his bowels work better. He feels that the Risperdal has helped because his negative thoughts have not bleed into his dreams like that used to. Encourage him to do his meditation in his room.  It is raining today  and the group will not be going outside.  Derek Weaver, Derek Weaver 01/04/2012 2:45 PM

## 2012-01-04 NOTE — Progress Notes (Signed)
Patient ID: Derek Weaver, male   DOB: 16-Jul-1961, 51 y.o.   MRN: [id number] Pt. Denies lethality and A/V/H's or other disturbances.  Pt. Described his depression as "5"/10 and Hopeless-/helplessnesss as 0/10. Pt. Referred to having had headaches within the last 24 hours, but did not c/o any headache today.

## 2012-01-04 NOTE — Progress Notes (Signed)
[facility] Group Notes: (Counselor/Nursing/MHT/Case Management/Adjunct) 01/04/2012   @  11:00am  Finding Balance in Life  Type of Therapy:  Group Therapy  Participation Level:  Active  Participation Quality:  Appropriate, Sharing  Affect:  Blunted  Cognitive:  Appropriate  Insight:  Good  Engagement in Group: Good  Engagement in Therapy:  Good  Modes of Intervention:  Support and Exploration  Summary of Progress/Problems: Juddson explored how he goes to extremes with expectations of himself, and how this is a symptom of his disorder. He shared how he cleared a lot with hand tools in 3 days because his mind would not let him rest until the job he had started was finished. [place] processed what balance would look like in his life.   [name] 01/04/2012   4:10 PM      [facility] Group Notes: (Counselor/Nursing/MHT/Case Management/Adjunct) 01/04/2012   @1 :15pm Risk and Protective Factors for Suicide  Type of Therapy:  Group Therapy  Participation Level:  Limited  Participation Quality: Attentive    Affect:  Blunted  Cognitive:  Appropriate  Insight:  None  Engagement in Group: LImited  Engagement in Therapy: None   Modes of Intervention:  Support and Exploration  Summary of Progress/Problems: Rube  was attentive but not engaged in group process    [name] 01/04/2012 4:13 PM

## 2012-01-04 NOTE — Progress Notes (Signed)
Patient ID: Derek Weaver, male   DOB: 10/19/1960, 51 y.o.   MRN: [id number] Pt. Preparing for karaoke, reports depression at "5" of 10. Pt. Denies SHI, reports that he is still waiting for placement. "I hope to be out of here by Monday." Staff will monitor q50min for safety.

## 2012-01-04 NOTE — Progress Notes (Signed)
Pt laying in bed resting with eyes closed. Respirations even and unlabored. No distress noted.  

## 2012-01-04 NOTE — Tx Team (Signed)
Interdisciplinary Treatment Plan Update (Adult)  Date:  01/04/2012  Time Reviewed:  10:42 AM   Progress in Treatment: Attending groups: Yes Participating in groups:  Yes, quiet in group but can discuss the concepts in group Taking medication as prescribed:  Yes Tolerating medication: Yes Family/Significant othe contact made:  No, would not give consent for contact Patient understands diagnosis: Yes Discussing patient identified problems/goals with staff:  Yes Medical problems stabilized or resolved: Yes Denies suicidal/homicidal ideation: Yes Issues/concerns per patient self-inventory:  No  Other:  New problem(s) identified: None  Reason for Continuation of Hospitalization: Depression, Anxiety, Medication Stabilization  Interventions implemented related to continuation of hospitalization:  Medication stabilization, safety checks q 15 mins, group attendance  Additional comments:  Estimated length of stay: 2-3 days  Discharge Plan: Brion has chosen to discharge to [facility] to attend the [employer]):  Review of initial/current patient goals per problem list:    1. Goal(s): Eliminate SI/other thoughts of self harm  Met: No - Patient continues to endorse off/on thoughts of self harm  Target date: d/c  As evidenced by: Denies any thoughts of suicide but still having thoughts of cutting    2. Goal (s): Reduce depression/anxiety to ratings of 4 or less Met: Yes Target date: d/c  As evidenced by: Clide Cliff reports his depression at 4 and anxiety at 0   3. Goal(s): Stabilize on meds  Met: Yes Target date:d/c  As evidenced by: .Patient reports being stable on medications - symptoms have decreased and no intolerable side effects   4. Goal(s): Refer for outpatient follow up  Met: Yes Target date: d/c  As evidenced by:Will follow up with [facility], who will provide treatment   Attendees: Patient:  Derek Weaver 01/04/2012 10:42AM    Family:     Physician:  Dr Orson Aloe, MD 01/04/2012 10:42 AM  Nursing:   Lawson Fiscal, RN 01/04/2012 10:42 AM  Case Manager:  Juline Patch, LCSW 01/04/2012 10:42 AM  Counselor:  Angus Palms, LCSW 01/04/2012 10:42 AM  Other:  Reyes Ivan, LCSWA 01/04/2012 10:42 AM  Other:     Other:     Other:      Scribe for Treatment Team:   [name], 01/04/2012 10:42 AM

## 2012-01-05 DIAGNOSIS — F1910 Other psychoactive substance abuse, uncomplicated: Secondary | ICD-10-CM

## 2012-01-05 DIAGNOSIS — F332 Major depressive disorder, recurrent severe without psychotic features: Secondary | ICD-10-CM

## 2012-01-05 NOTE — Progress Notes (Signed)
01/05/2012         Time: 1415      Group Topic/Focus: The focus of this group is on enhancing patients' problem solving skills, which involves identifying the problem, brainstorming solutions and choosing and trying a solution.   Participation Level: Active  Participation Quality: Attentive  Affect: Appropriate  Cognitive: Oriented  Additional Comments: Patient bright, reports his "depression has moved out."   Melinda Pottinger 01/05/2012 3:42 PM

## 2012-01-05 NOTE — Progress Notes (Signed)
[facility] Group Notes: (Counselor/Nursing/MHT/Case Management/Adjunct) 01/05/2012   @11 :00am Preventing Relapse  Type of Therapy:  Group Therapy  Participation Level:  Minimal  Participation Quality: Attentive   Affect:  Blunted  Cognitive:  Appropriate  Insight:  None  Engagement in Group: Minimal  Engagement in Therapy:  None  Modes of Intervention:  Support and Exploration  Summary of Progress/Problems: Derek Weaver  was attentive but not engaged in group process. He was called out early by nurse practitioner  Derek Weaver, Derek Weaver 01/05/2012 4:16 PM             [facility] Group Notes: (Counselor/Nursing/MHT/Case Management/Adjunct) 01/05/2012   @1 :15pm [facility] [place] [facility] [place]  Type of Therapy:  Group Therapy  Participation Level:  Good  Participation Quality:  Good  Affect:  Appropriate  Cognitive:  Appropriate  Insight:  Good  Engagement in Group:  Good  Engagement in Therapy:  Good  Modes of Intervention:  Support and Exploration  Summary of Progress/Problems: Derek Weaver  participated with speaker from [facility] [place] and expressed interest in programs [facility] offers. He spoke briefly about his experience with [facility] groups and encouraged others to become involved.   Derek Weaver 01/05/2012  4:17 PM

## 2012-01-05 NOTE — Progress Notes (Signed)
[facility] MD Progress Note  01/05/2012 11:38 AM  Diagnosis:  Axis I: Alcohol Abuse, Major Depression, Recurrent severe, Substance Abuse and Substance Induced Mood Disorder Axis II: Borderline Personality Dis.  ADL's:  Intact  Sleep: Fair. Pt. States he had trouble staying asleep.  Appetite:  Good  Suicidal Ideation: " A couple of thoughts" pt. Reports this has decreased by 80% since his admission. Homicidal Ideation:  Denies adamantly any homicidal thoughts.  Mental Status Examination/Evaluation: Objective:  Appearance: Casual  Eye Contact::  Good  Speech:  Clear and Coherent  Volume:  Normal  Mood:  Depressed, rates this currently at a 4/10/. Denies Anxiety or feelings of hopelessness.  Affect:  Congruent  Thought Process:  Coherent  Orientation:  Full  Thought Content:  WDL denies AH/VH  Suicidal Thoughts:  "a couple of thoughts today."  Homicidal Thoughts:  No  Memory:  Immediate;   Fair  Judgement:  Fair  Insight:  Fair  Psychomotor Activity:  Normal  Concentration:  Fair,   Recall:  Fair  Akathisia:  No  Handed:  Right  AIMS (if indicated):     Assets:  Communication Skills Desire for Improvement  Sleep:  Number of Hours: 4.75    ROS: Neuro: had headache this AM, but no, ataxia, weakness  GI: denies N/V/D/cramps, but still slight constipation.    MS: denies weakness, muscle cramps, aches.  Vital Signs:Blood pressure 110/75, pulse 73, temperature 98 F (36.7 C), temperature source Oral, resp. rate 14, height 5\' 9"  (1.753 m), weight 78.019 kg (172 lb).  Current Medications: Current Facility-Administered Medications  Medication Dose Route Frequency Provider Last Rate Last Dose  . acetaminophen (TYLENOL) tablet 650 mg  650 mg Oral Q6H PRN Mike Craze, MD      . alum & mag hydroxide-simeth (MAALOX/MYLANTA) 200-200-20 MG/5ML suspension 30 mL  30 mL Oral Q4H PRN Mike Craze, MD      . buPROPion [facility] SR) 12 hr tablet 150 mg  150 mg Oral BID Mike Craze,  MD   150 mg at 01/05/12 0828  . chlordiazePOXIDE (LIBRIUM) capsule 25 mg  25 mg Oral Q6H PRN Mike Craze, MD      . citalopram (CELEXA) tablet 20 mg  20 mg Oral Daily Mike Craze, MD   20 mg at 01/05/12 4098  . docusate sodium (COLACE) capsule 100 mg  100 mg Oral BID PRN Mike Craze, MD   100 mg at 01/04/12 1725  . magnesium hydroxide (MILK OF MAGNESIA) suspension 30 mL  30 mL Oral Daily PRN Mike Craze, MD      . nicotine (NICODERM CQ - dosed in mg/24 hours) patch 21 mg  21 mg Transdermal Q0600 Mike Craze, MD   21 mg at 01/05/12 1191  . risperiDONE (RISPERDAL) tablet 0.25 mg  0.25 mg Oral BID Mike Craze, MD   0.25 mg at 01/05/12 4782  . topiramate (TOPAMAX) tablet 100 mg  100 mg Oral Daily Sanjuana Kava, NP   100 mg at 01/05/12 0828  . topiramate (TOPAMAX) tablet 50 mg  50 mg Oral Q2000 Sanjuana Kava, NP   50 mg at 01/04/12 2001  . DISCONTD: buPROPion (WELLBUTRIN SR) 12 hr tablet 100 mg  100 mg Oral BID Mike Craze, MD   100 mg at 01/04/12 0831  . DISCONTD: citalopram (CELEXA) tablet 40 mg  40 mg Oral Daily Sanjuana Kava, NP   40 mg at 01/04/12 [phone]  Lab Results: No results found for this or any previous visit (from the past 48 hour(s)). Patient is seen and his current symptoms are discussed.  He tells me he is much better today than upon his admission.  He has plans to discharge on Monday to go to a program in [place].  [facility] notes that the risperdal does help a lot and he plans to stay on it.  Physical Findings: AIMS:  CIWA:  CIWA-Ar Total: 2  COWS:    Treatment Plan Summary: Daily contact with patient to assess and evaluate symptoms and progress in treatment Medication management Mood and anxiety are below 3/10 on the scale of 1 is the best and 10 is the worst, no suicidal thoughts for 48 hours.  Plan: 1. Continue current plan of care. 2. No changes to medication at this time. 3. Plan to discharge on Monday if there are no  complications.  Nolia Tschantz 01/05/2012 11:38 AM

## 2012-01-05 NOTE — Progress Notes (Signed)
Patient seen during d/c planning group.  He advised of being better today and denies SI/HI.   He reports decreased thoughts of self-harm.  Patient rates depression at four and helplessness at two.  He denies anxiety and hopelessness.Derek Weaver  He is plans to discharge to [facility] on Monday.

## 2012-01-05 NOTE — Progress Notes (Addendum)
Derek Weaver is seen at the med window this morning..he has a flat, sad affect. He completed his self inventory and on it he wrote  He cont to have " off and on " SI,and he verbally contracted with this nurse for safety.He rated his depression and hopelessness " 4 / 1 /" and stated he plans to " continue treatment" once he is discharged. HE shared with this writer he is planning on being discharged from this hospital Monday. He shares that he is glad to be here this upcoming weekend. He is compliant with his treatment, attending his groups, taking his meds as ordered. POC is in place with trust established, DC planning continuing and therapeutic relationship fostered. PD RN [facility]

## 2012-01-06 NOTE — Progress Notes (Signed)
[facility] Group Notes:  (Counselor/Nursing/MHT/Case Management/Adjunct)  01/06/2012 4:17 PM  Type of Therapy:  Group Therapy  Participation Level:  Active  Participation Quality:  Appropriate and Attentive  Affect:  Appropriate  Cognitive:  Appropriate  Insight:  Good  Engagement in Group:  Good  Engagement in Therapy:  Good  Modes of Intervention:  Clarification, Education, Socialization and Support  Summary of Progress/Problems: Pt. participated in counseling group on self sabotaging behaviors and enabling themselves in a positive way in order to change the way they think and to make the changes that they need. The pt. Spoke about when he heard the word self sabotaging that it meant " punishment" to him. Pt. spoke about his experience in prison and how the he was afraid of success and that how he self sabotages himself is buy when things are going well in his life he purposely does something to mess up things in his life. Pt. reflected on how he han stop him self before self sabotaging himself and how he can do things positive to help enable him self by working in a garden or doing something that he enjoys.   Neila Gear 01/06/2012, 4:17 PM

## 2012-01-06 NOTE — Progress Notes (Signed)
[facility] MD Progress Note  01/06/2012 4:27 PM  Diagnosis:   Axis I: Alcohol Abuse, Major Depression, Recurrent severe and Substance Abuse Axis II: Borderline Personality Dis. Axis III:  Past Medical History  Diagnosis Date  . Depression   . Anxiety   . Personality disorder     Subjective: Derek Weaver reports that he did have some thoughts last night about cutting, but did not make any sort of attempt. He denies any thoughts of self-harm today. He denies any thoughts of harming others, or any auditory or visual hallucinations. He reports his sleep and appetite are good. He is awaiting placement in a facility for treatment for cutters.  ADL's:  Intact  Sleep: Good  Appetite:  Good  Suicidal Ideation:  Patient denies Homicidal Ideation:  Patient denies  AEB (as evidenced by):  Mental Status Examination/Evaluation: Objective:  Appearance: Casual and Fairly Groomed  Eye Contact::  Good  Speech:  Clear and Coherent  Volume:  Normal  Mood:  Anxious and Dysphoric  Affect:  Congruent  Thought Process:  Logical  Orientation:  Full  Thought Content:  WDL  Suicidal Thoughts:  No  Homicidal Thoughts:  No  Memory:  Immediate;   Good Recent;   Good Remote;   Good  Judgement:  Fair  Insight:  Fair  Psychomotor Activity:  Normal  Concentration:  Good  Recall:  Good  Akathisia:  No  Handed:    AIMS (if indicated):     Assets:  Desire for Improvement  Sleep:  Number of Hours: 6.75    Vital Signs:Blood pressure 112/76, pulse 89, temperature 97.8 F (36.6 C), temperature source Oral, resp. rate 18, height 5\' 9"  (1.753 m), weight 78.019 kg (172 lb). Current Medications: Current Facility-Administered Medications  Medication Dose Route Frequency Provider Last Rate Last Dose  . acetaminophen (TYLENOL) tablet 650 mg  650 mg Oral Q6H PRN Mike Craze, MD      . alum & mag hydroxide-simeth (MAALOX/MYLANTA) 200-200-20 MG/5ML suspension 30 mL  30 mL Oral Q4H PRN Mike Craze, MD      .  buPROPion [facility] SR) 12 hr tablet 150 mg  150 mg Oral BID Mike Craze, MD   150 mg at 01/06/12 0906  . chlordiazePOXIDE (LIBRIUM) capsule 25 mg  25 mg Oral Q6H PRN Mike Craze, MD      . citalopram (CELEXA) tablet 20 mg  20 mg Oral Daily Mike Craze, MD   20 mg at 01/06/12 0906  . docusate sodium (COLACE) capsule 100 mg  100 mg Oral BID PRN Mike Craze, MD   100 mg at 01/04/12 1725  . magnesium hydroxide (MILK OF MAGNESIA) suspension 30 mL  30 mL Oral Daily PRN Mike Craze, MD      . nicotine (NICODERM CQ - dosed in mg/24 hours) patch 21 mg  21 mg Transdermal Q0600 Mike Craze, MD   21 mg at 01/06/12 0657  . risperiDONE (RISPERDAL) tablet 0.25 mg  0.25 mg Oral BID Mike Craze, MD   0.25 mg at 01/06/12 0907  . topiramate (TOPAMAX) tablet 100 mg  100 mg Oral Daily Sanjuana Kava, NP   100 mg at 01/06/12 0906  . topiramate (TOPAMAX) tablet 50 mg  50 mg Oral Q2000 Sanjuana Kava, NP   50 mg at 01/05/12 2006    Lab Results: No results found for this or any previous visit (from the past 48 hour(s)).  Physical Findings: AIMS:  , ,  ,  ,  CIWA:  CIWA-Ar Total: 2  COWS:     Treatment Plan Summary: Daily contact with patient to assess and evaluate symptoms and progress in treatment Medication management  Plan: We will continue his current plan of care. He is awaiting transfer to another facility. Catherine Oak 01/06/2012, 4:27 PM

## 2012-01-06 NOTE — Progress Notes (Signed)
Pt. Admits to passive SI. Rates his depression at a 3 and his hopelessness at a 0. Attended the program and interacts with his peers appropriately. Denies HI. States that he is feeling a little better. Affect is becoming more spontaneous. Given support and reassurance along with praise.

## 2012-01-06 NOTE — Progress Notes (Signed)
[facility] Group Notes:  (Counselor/Nursing/MHT/Case Management/Adjunct)  01/06/2012 10:55 AM  Type of Therapy:  After Care Planning Group  Pt.  participated in  After care planning group and was given [place] Suicide Prevention information and crisis and hot line numbers and agreed to use them if needed. The pt. agreed to use them if needed. The pt. Was also given the number for [facility] mobile crisis unit and also agreed to use it if needed. The pt. Stated that he felt like a 6.5 in terms of his energy level today and that he will be d/c on Monday and [facility] [place], [place].  Pt. also stated that he had medication changes and denied any SI or HI.   Lamar Blinks [place] 01/06/2012, 10:55 AM

## 2012-01-06 NOTE — Progress Notes (Signed)
Patient ID: Derek Weaver, male   DOB: 06/12/1961, 51 y.o.   MRN: [id number] Pt. In bed, resting quietly, resp. Even, unlabored, no distress noted. Staff will monitor q50min for safety.

## 2012-01-07 ENCOUNTER — Encounter (HOSPITAL_COMMUNITY): Payer: Self-pay

## 2012-01-07 NOTE — Progress Notes (Addendum)
[facility] Group Notes:  (Counselor/Nursing/MHT/Case Management/Adjunct)  01/07/2012 11:01 AM  Type of Therapy:  After Care Planning group  Pt. participated in after care planning group and was given [place] SI information and crisis hotline numbers. Pt. Agreed to use them if needed. Pt. Was also given information on the [facility] and local support groups located in [place]. Pt. Spoke about doing good today( thumbs up) and will be d/c on Monday and going to program in [facility] after d/c. Pt. States he saw doctor yesturday but made no changes to his medication. Lamar Blinks [place] 01/07/2012, 11:01 AM

## 2012-01-07 NOTE — Progress Notes (Signed)
Psychoeducational Group Note  Date:  01/07/2012 Time:  1515  Group Topic/Focus:  Healthy Communication:   The focus of this group is to discuss communication, barriers to communication, as well as healthy ways to communicate with others. Group discussed positive and negative communication skills, "I feel" statements, and created an "I feel" statement that applied to their crises.   Participation Level:  Active  Participation Quality:  Appropriate, Attentive, Sharing and Supportive  Affect:  Appropriate  Cognitive:  Alert, Appropriate and Oriented  Insight:  Good  Engagement in Group:  Good  Additional Comments:    Wandra Scot 01/07/2012, 6:57 PM

## 2012-01-07 NOTE — Progress Notes (Signed)
Patient ID: Derek Weaver, male   DOB: 02/02/1961, 51 y.o.   MRN: [id number] The patient is pleasant and interacting appropriately in the milieu. He attended the evening wrap up group.Stated that he is going to a long term treatment facility upon discharge. He is still trying to accept that he needs this extensive rehab.

## 2012-01-07 NOTE — Progress Notes (Signed)
Pt continues to voice passive SI stating that he has thoughts on and off throughout the day. Did attend all the groups and interacts appropriately with his peers. Given support and reassurance throughout the day.

## 2012-01-07 NOTE — Progress Notes (Signed)
[facility] MD Progress Note  01/07/2012 2:37 PM  Diagnosis:   Axis I: Alcohol Abuse, Major Depression, Recurrent severe, Substance Abuse and Substance Induced Mood Disorder Axis II: Borderline Personality Dis. Axis III:  Past Medical History  Diagnosis Date  . Depression   . Anxiety   . Personality disorder    Subjective: Derek Weaver reports that he is doing pretty well today. He denies any thoughts or desire for self-mutilation. He denies any suicidal or homicidal thoughts. He denies any auditory or visual hallucinations. He reports that his new roommate kept him awake last night because of snoring. He endorses a good appetite. He is looking forward to discharging so that he can go to the [facility] [place]. He feels that the medications currently prescribed are effective and dosed properly.  ADL's:  Intact  Sleep: Fair  Appetite:  Good  Suicidal Ideation:  Patient denies any thought, plan, intent Homicidal Ideation:  Patient denies any thought, plan, intent  AEB (as evidenced by):  Mental Status Examination/Evaluation: Objective:  Appearance: Casual  Eye Contact::  Good  Speech:  Clear and Coherent  Volume:  Normal  Mood:  Dysphoric  Affect:  Congruent  Thought Process:  Linear  Orientation:  Full  Thought Content:  WDL  Suicidal Thoughts:  No  Homicidal Thoughts:  No  Memory:  Immediate;   Good Recent;   Good Remote;   Good  Judgement:  Fair  Insight:  Fair  Psychomotor Activity:  Normal  Concentration:  Good  Recall:  Good  Akathisia:  No  Handed:    AIMS (if indicated):     Assets:  Desire for Improvement  Sleep:  Number of Hours: 6.25    Vital Signs:Blood pressure 107/73, pulse 66, temperature 97.9 F (36.6 C), temperature source Oral, resp. rate 18, height 5\' 9"  (1.753 m), weight 78.019 kg (172 lb). Current Medications: Current Facility-Administered Medications  Medication Dose Route Frequency Provider Last Rate Last Dose  . acetaminophen (TYLENOL) tablet  650 mg  650 mg Oral Q6H PRN Mike Craze, MD      . alum & mag hydroxide-simeth (MAALOX/MYLANTA) 200-200-20 MG/5ML suspension 30 mL  30 mL Oral Q4H PRN Mike Craze, MD      . buPROPion [facility] SR) 12 hr tablet 150 mg  150 mg Oral BID Mike Craze, MD   150 mg at 01/07/12 0915  . chlordiazePOXIDE (LIBRIUM) capsule 25 mg  25 mg Oral Q6H PRN Mike Craze, MD      . citalopram (CELEXA) tablet 20 mg  20 mg Oral Daily Mike Craze, MD   20 mg at 01/07/12 0915  . magnesium hydroxide (MILK OF MAGNESIA) suspension 30 mL  30 mL Oral Daily PRN Mike Craze, MD      . nicotine (NICODERM CQ - dosed in mg/24 hours) patch 21 mg  21 mg Transdermal Q0600 Mike Craze, MD   21 mg at 01/07/12 0610  . risperiDONE (RISPERDAL) tablet 0.25 mg  0.25 mg Oral BID Mike Craze, MD   0.25 mg at 01/07/12 0914  . topiramate (TOPAMAX) tablet 100 mg  100 mg Oral Daily Sanjuana Kava, NP   100 mg at 01/07/12 0914  . topiramate (TOPAMAX) tablet 50 mg  50 mg Oral Q2000 Sanjuana Kava, NP   50 mg at 01/06/12 2006    Lab Results: No results found for this or any previous visit (from the past 48 hour(s)).  Physical Findings: AIMS:  , ,  ,  ,  CIWA:  CIWA-Ar Total: 0  COWS:     Treatment Plan Summary: Daily contact with patient to assess and evaluate symptoms and progress in treatment Medication management  Plan: We will continue the current plan of care, and the patient will likely be discharged tomorrow to another facility.  Derek Weaver 01/07/2012, 2:37 PM

## 2012-01-07 NOTE — Progress Notes (Signed)
[facility] Group Notes:  (Counselor/Nursing/MHT/Case Management/Adjunct)  01/07/2012 12:40 PM  Type of Therapy:  Psychoeducational Skills  Participation Level:  Active  Participation Quality:  Appropriate, Attentive, Sharing and Supportive  Affect:  Appropriate  Cognitive:  Alert, Appropriate and Oriented  Insight:  Good  Engagement in Group:  Good  Engagement in Therapy:  n/a  Modes of Intervention:  Activity, Education, Socialization and Support  Summary of Progress/Problems: Derek Weaver attended Psychoeducational group on spirituality. Derek Weaver was active as group read excerpt on grounding oneself and discussed what kind of activities they can schedule into their day to feed their spirituality. Derek Weaver stated tat he enjoys daily meditation and stretching to feed his spirituality.   Wandra Scot 01/07/2012, 12:40 PM

## 2012-01-07 NOTE — Progress Notes (Signed)
[facility] Group Notes:  (Counselor/Nursing/MHT/Case Management/Adjunct)  01/07/2012 6:38 PM  Type of Therapy:  Group Therapy  Participation Level:  Active  Participation Quality:  Appropriate and Attentive  Affect:  Appropriate  Cognitive:  Appropriate  Insight:  Good  Engagement in Group:  Good  Engagement in Therapy:  Good  Modes of Intervention:  Clarification, Education, Socialization and Support  Summary of Progress/Problems: Pt. participated in group on supports and how to distingished between health and unhealthy supports. The pt. Gave a word or phase as to what support meant to them and who is a support in their lives and how that person supports them. Each pt. Was encouraged to seek support from multiple supports and to learn how tho support themselves when their supports are not around. Each pt was encouraged to use coping skills  that they have learned at [facility] to help them in their recovery after they leave [facility]. Pt. Spoke about his AA and [facility] support groups being his support. Pt. Shared with the group that he has not seen any of his family since age 8 and that he has recently reached out to try to find them. Neila Gear 01/07/2012, 6:38 PM

## 2012-01-08 MED ORDER — RISPERIDONE 0.25 MG PO TABS: 0.2500 mg | ORAL_TABLET | Freq: Two times a day (BID) | ORAL | Status: DC

## 2012-01-08 MED ORDER — CITALOPRAM HYDROBROMIDE 10 MG PO TABS
10.0000 mg | ORAL_TABLET | Freq: Every day | ORAL | Status: DC
  Filled 2012-01-08: qty 14
  Filled 2012-01-08: qty 1

## 2012-01-08 MED ORDER — TOPIRAMATE 100 MG PO TABS: 100.0000 mg | ORAL_TABLET | Freq: Every day | ORAL | Status: DC

## 2012-01-08 MED ORDER — BUPROPION HCL ER (SR) 150 MG PO TB12: 150.0000 mg | ORAL_TABLET | Freq: Two times a day (BID) | ORAL | Status: DC

## 2012-01-08 MED ORDER — CITALOPRAM HYDROBROMIDE 20 MG PO TABS: 20.0000 mg | ORAL_TABLET | Freq: Every day | ORAL | Status: DC

## 2012-01-08 MED ORDER — CITALOPRAM HYDROBROMIDE 10 MG PO TABS: 10.0000 mg | ORAL_TABLET | Freq: Every day | ORAL | Status: DC

## 2012-01-08 MED ORDER — TOPIRAMATE 100 MG PO TABS: ORAL_TABLET | ORAL | Status: DC

## 2012-01-08 NOTE — Progress Notes (Signed)
Patient ID: Derek Weaver, male   DOB: May 13, 1961, 51 y.o.   MRN: [id number] The patient had a neutral mood and affect. Pleasant and cooperative. Interacting in the milieu. Attended the evening wrap up group. Denied any suicidal ideation.

## 2012-01-08 NOTE — Progress Notes (Signed)
[facility] MD Progress Note  01/08/2012 11:45 AM  Diagnosis:  Axis I: Alcohol Abuse, Major Depression, Recurrent severe, Substance Abuse and Substance Induced Mood Disorder Axis II: Borderline Personality Dis.  ADL's:  Intact  Sleep: Good, a little better last night.  Appetite:  Good  Suicidal Ideation:  Pt had some suicidal/self harm thoughts today.  Homicidal Ideation:  Denies adamantly any homicidal thoughts.  Mental Status Examination/Evaluation: Objective:  Appearance: Casual  Eye Contact::  Good  Speech:  Clear and Coherent  Volume:  Normal  Mood:  Euthymic, Will hold Wellbutrin the same and back further off the Celexa.  Affect:  Congruent  Thought Process:  Coherent  Orientation:  Full  Thought Content:  WDL  Suicidal Thoughts:  No  Homicidal Thoughts:  No  Memory:  Immediate;   Fair  Judgement:  Good  Insight:  Good  Psychomotor Activity:  Normal  Concentration:  Good,  Recall:  Good  Akathisia:  No  Handed:  Right  AIMS (if indicated):     Assets:  Communication Skills Desire for Improvement  Sleep:  Number of Hours: 6.25     Vital Signs:Blood pressure 114/70, pulse 77, temperature 97.6 F (36.4 C), temperature source Oral, resp. rate 18, height 5\' 9"  (1.753 m), weight 78.019 kg (172 lb). Current Medications: Current Facility-Administered Medications  Medication Dose Route Frequency Provider Last Rate Last Dose  . acetaminophen (TYLENOL) tablet 650 mg  650 mg Oral Q6H PRN Mike Craze, MD      . alum & mag hydroxide-simeth (MAALOX/MYLANTA) 200-200-20 MG/5ML suspension 30 mL  30 mL Oral Q4H PRN Mike Craze, MD      . buPROPion [facility] SR) 12 hr tablet 150 mg  150 mg Oral BID Mike Craze, MD   150 mg at 01/08/12 0807  . chlordiazePOXIDE (LIBRIUM) capsule 25 mg  25 mg Oral Q6H PRN Mike Craze, MD      . citalopram (CELEXA) tablet 10 mg  10 mg Oral Daily Mike Craze, MD      . magnesium hydroxide (MILK OF MAGNESIA) suspension 30 mL  30 mL Oral  Daily PRN Mike Craze, MD      . nicotine (NICODERM CQ - dosed in mg/24 hours) patch 21 mg  21 mg Transdermal Q0600 Mike Craze, MD   21 mg at 01/08/12 8119  . risperiDONE (RISPERDAL) tablet 0.25 mg  0.25 mg Oral BID Mike Craze, MD   0.25 mg at 01/08/12 0806  . topiramate (TOPAMAX) tablet 100 mg  100 mg Oral Daily Sanjuana Kava, NP   100 mg at 01/08/12 0807  . topiramate (TOPAMAX) tablet 50 mg  50 mg Oral Q2000 Sanjuana Kava, NP   50 mg at 01/07/12 2014  . DISCONTD: citalopram (CELEXA) tablet 20 mg  20 mg Oral Daily Mike Craze, MD   20 mg at 01/08/12 1478    Lab Results: No results found for this or any previous visit (from the past 48 hour(s)).  Physical Findings: AIMS:  , ,  ,  ,    CIWA:  CIWA-Ar Total: 0  COWS:     Treatment Plan Summary: Daily contact with patient to assess and evaluate symptoms and progress in treatment Medication management Mood and anxiety are below 3/10 on the scale of 1 is the best and 10 is the worst, no suicidal thoughts for 48 hours.  Plan: Pt seen in treatment team today. Depression better. D/C today.  [name], Nafeesah Lapaglia  01/08/2012 11:45 AM

## 2012-01-08 NOTE — Discharge Summary (Signed)
Physician Discharge Summary Note  Patient:  Derek Weaver is an 51 y.o., male MRN:  [id number] DOB:  12-10-1960 Patient phone:  [phone] (home)  Patient address:   [address] [place] [address],   Date of Admission:  12/26/2011 Date of Discharge: 01/08/12  Reason for Admission: Suicidal thoughts and depression. Discharge Diagnoses: Active Problems:  Psychoactive substance-induced organic mood disorder  Borderline behavior  Cannabis abuse, uncomplicated  Alcohol abuse  Major depression   Axis Diagnosis:   AXIS I:  Alcohol Abuse and Psychoactive substance-induced organic mood disorder, Cannabis abuse, umcomplicated AXIS II:  Cluster B Traits AXIS III:   Past Medical History  Diagnosis Date  . Depression   . Anxiety   . Personality disorder    AXIS IV:  economic problems, housing problems, occupational problems and other psychosocial or environmental problems AXIS V:  65  Level of Care:  OP  Hospital Course: This is a 51 year old Caucasian male. [name]. Patient reports, "I came from [facility]. I had to go to the hospital to be checked out because I have been having suicidal thoughts and my depression is getting worse. This has been going on x 2 weeks. When I get to feeling this way, I cut myself pretty bad to feel better. I was at the [facility] x 2 months from February to April the 11th of this year. When I was being discharged, the social worker at the hospital was trying to arrange a place for me to live. But as soon I got out, I was thrown back into jail for 23 days for nonpayment of court fees. I got out of jail about 9 days ago. I have been living at the [facility] here in [place]. But I got fed up of living with drunks, loafers and drug addicts. So I left there and have been living in the woods, under bridge and any place that people can't get close to me or come around. I  still do not know how to handle my depression and suicidal thoughts".   While a patient in this hospital, Mr. Derek Weaver received medication management for his depressive symptoms. He was prescribed and received Wellbutrin SR 150 mg twice daily, Risperdal 0.25 mg twice daily for mood control while Citalopram 10 mg is being tapered off and Topamax 100 mg daily and 50 mg Q bedtime for mood control and stabilization. Patient tolerated his medications without any significant adverse effects and or reactions.   Patient was also enrolled in group counseling and activities. He participated actively on daily basis. He did report on daily basis improved mood and symptoms. This is also evidenced by patient's presentation of good affect. He attended treatment team meeting this am and met with his treatment members. Patient's symptoms were discussed. He endorsed that he is stable for discharge. And to maintain stability, patient will continue psychiatric care on outpatient basis. He will follow-up at the [facility] in [facility]. This is because patient has decided to move to [facility]. The address, date and time for this appointment provided for patient. Patient received a 2 weeks worth samples of his [facility] discharge medications and a bus ticket for his tranportation fare to [place], [place].  Upon discharge, patient adamantly denies suicidal, homicidal ideations, auditory, visual hallucinations and or delusional thinking. He left [facility] facility with all personal belongings via bus transport. He was in no apparent distress.  Consults:  None  Significant Diagnostic Studies:  labs: CBC, CMP, Urinalysis  Discharge Vitals:   Blood pressure 114/70, pulse 77, temperature 97.6 F (36.4 C), temperature source Oral, resp. rate 18, height 5\' 9"  (1.753 m), weight 78.019 kg (172 lb).  Mental Status Exam: See Mental Status Examination and Suicide Risk Assessment completed by Attending Physician prior to  discharge.  Discharge destination:  Other:  Patient is relocating to [place], [place]  Is patient on multiple antipsychotic therapies at discharge:  No   Has Patient had three or more failed trials of antipsychotic monotherapy by history:  No  Recommended Plan for Multiple Antipsychotic Therapies: NA   Medication List  As of 01/08/2012  5:58 PM   TAKE these medications      Indication    buPROPion 150 MG 12 hr tablet   Commonly known as: WELLBUTRIN SR   Take 1 tablet (150 mg total) by mouth 2 (two) times daily. For depression       citalopram 10 MG tablet   Commonly known as: CELEXA   Take 1 tablet (10 mg total) by mouth daily. For depression       risperiDONE 0.25 MG tablet   Commonly known as: RISPERDAL   Take 1 tablet (0.25 mg total) by mouth 2 (two) times daily. For mood control       topiramate 100 MG tablet   Commonly known as: TOPAMAX   Take 100 mg at 8:00 am and  50 mg at Bedtime: For mood stabilization            Follow-up Information    Follow up with [facility] on 01/09/2012. (You are scheduled to be seen at [facility] at 12:00 Noon on Tuesday, January 09, 2012)    Contact information:   [address] [place], [place]  [address]  [phone]         Follow-up recommendations:  Activity:  as tolerated. Other:  Patient is instructed to keep all scheduled follow-up appointments as recommended.  Comments:  Take all medications as prescribed. Report to your outpatient provider any adverse effects from your medicines promptly. Patient is instructed and cautioned to abstain from alcohol and or illegal drug use while on prescription medicines. In the even of worsening symptoms, patient is instructed to call the crisis hot line, 911 and or go to the nearest ED.   SignedArmandina Stammer I 01/08/2012, 5:58 PM

## 2012-01-08 NOTE — Progress Notes (Signed)
Discharged from [facility] w/prescriptions, bus pass and train eticket and samples of meds. Patient excieted to be going to a new place for treatment, signed release for information to be sent to [place], [place]. Patient has had a good day w/groups, treatment team and gathering information for discharge. All belongings given to patient.

## 2012-01-08 NOTE — Progress Notes (Signed)
[facility] Case Management Discharge Plan:  Will you be returning to the same living situation after discharge: No.  Patient relocating to [facility] At discharge, do you have transportation home?:No. Patient assisted with bus ticket Do you have the ability to pay for your medications:No.  Patient assisted with indigent medications  Interagency Information:     Release of information consent forms completed and in the chart;  Patient's signature needed at discharge.  Patient to Follow up at:  Follow-up Information    Follow up with [facility] on 01/09/2012. (You are scheduled to be seen at [facility] at 12:00 Noon on Tuesday, January 09, 2012)    Contact information:   [address] [place], [place]  [address]  [phone]         Patient denies SI/HI:   Yes,  Derek Derek is no longer endorsing SI. He reports chronic thoughts of  self-harm but no intent.    Safety Planning and Suicide Prevention discussed:  Yes,  Review during aftercare groups   Barrier to discharge identified: Lack of permanent housing  Summary and Recommendations: Patient encourage to be compliant with medications and follow up with outpatient recommedations    Derek Derek, Derek Weaver Derek Weaver 01/08/2012, 10:48 AM
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
## 2012-01-08 NOTE — Progress Notes (Signed)
[facility] Group Notes: (Counselor/Nursing/MHT/Case Management/Adjunct) 01/08/2012   @  11:00am Overcoming Obstacles to Wellness   Type of Therapy:  Group Therapy  Participation Level:  Active  Participation Quality: Appropriate, Sharing   Affect:  Appropraite  Cognitive:  Appropriate  Insight:  Limited  Engagement in Group: Good  Engagement in Therapy:  Good  Modes of Intervention:  Support and Exploration  Summary of Progress/Problems: Derek Weaver was attentive and talked briefly about his trouble with social wellness. He pointed out that the best way he had found to get over it was to tell someone about it. He identified always feeling different than, and seemed surprised that many other group members have had that same experience.  [name] 01/08/2012 4:00 PM     [facility] Group Notes: (Counselor/Nursing/MHT/Case Management/Adjunct) 01/08/2012   @1 :15pm Breathing & Meditation for Anxiety/Anger   Type of Therapy:  Group Therapy  Participation Level:  Active  Participation Quality: Appropriate    Affect:  Appropriate  Cognitive:  Appropriate  Insight:  Good  Engagement in Group: Good  Engagement in Therapy:  Good  Modes of Intervention:  Support and Exploration  Summary of Progress/Problems: Derek Weaver participated in color breathing and safe place guided imagery meditation. He stated that it helped him to feel relaxed and content.  [name] 01/08/2012 4:06 PM

## 2012-01-10 NOTE — Discharge Summary (Signed)
I agree with this D/C Summary.  

## 2012-01-10 NOTE — Progress Notes (Signed)
Patient Discharge Instructions:  After Visit Summary (AVS):   Faxed to:  01/09/2012 Psychiatric Admission Assessment Note:   Faxed to:  01/09/2012 Suicide Risk Assessment - Discharge Assessment:   Faxed to:  01/09/2012 Faxed/Sent to the Next Level Care provider:  01/09/2012  Faxed to [facility] @ [phone]  Wandra Scot, 01/10/2012, 12:19 PM

## 2015-06-08 ENCOUNTER — Encounter (HOSPITAL_COMMUNITY): Payer: Self-pay

## 2015-06-08 ENCOUNTER — Emergency Department (HOSPITAL_COMMUNITY)
Admission: EM | Admit: 2015-06-08 | Discharge: 2015-06-08 | Disposition: A | Discharge status: Home / Self Care | Payer: Self-pay | Attending: Emergency Medicine | Admitting: Emergency Medicine

## 2015-06-08 DIAGNOSIS — Z720 Tobacco use: Secondary | ICD-10-CM | POA: Insufficient documentation

## 2015-06-08 DIAGNOSIS — F1210 Cannabis abuse, uncomplicated: Secondary | ICD-10-CM | POA: Insufficient documentation

## 2015-06-08 DIAGNOSIS — F419 Anxiety disorder, unspecified: Secondary | ICD-10-CM | POA: Insufficient documentation

## 2015-06-08 DIAGNOSIS — F1010 Alcohol abuse, uncomplicated: Secondary | ICD-10-CM | POA: Insufficient documentation

## 2015-06-08 DIAGNOSIS — F329 Major depressive disorder, single episode, unspecified: Secondary | ICD-10-CM | POA: Insufficient documentation

## 2015-06-08 LAB — CBC
HCT: 44.8 % (ref 39.0–52.0)
Hemoglobin: 15.2 g/dL (ref 13.0–17.0)
MCH: 33.4 pg (ref 26.0–34.0)
MCHC: 33.9 g/dL (ref 30.0–36.0)
MCV: 98.5 fL (ref 78.0–100.0)
PLATELETS: 252 10*3/uL (ref 150–400)
RBC: 4.55 MIL/uL (ref 4.22–5.81)
RDW: 13.3 % (ref 11.5–15.5)
WBC: 9.4 10*3/uL (ref 4.0–10.5)

## 2015-06-08 LAB — COMPREHENSIVE METABOLIC PANEL
ALK PHOS: 99 U/L (ref 38–126)
ALT: 19 U/L (ref 17–63)
ANION GAP: 8 (ref 5–15)
AST: 26 U/L (ref 15–41)
Albumin: 3.9 g/dL (ref 3.5–5.0)
BILIRUBIN TOTAL: 0.8 mg/dL (ref 0.3–1.2)
BUN: 9 mg/dL (ref 6–20)
CALCIUM: 9.2 mg/dL (ref 8.9–10.3)
CO2: 31 mmol/L (ref 22–32)
Chloride: 98 mmol/L — ABNORMAL LOW (ref 101–111)
Creatinine, Ser: 1.11 mg/dL (ref 0.61–1.24)
Glucose, Bld: 108 mg/dL — ABNORMAL HIGH (ref 65–99)
Potassium: 3.5 mmol/L (ref 3.5–5.1)
SODIUM: 137 mmol/L (ref 135–145)
TOTAL PROTEIN: 6.7 g/dL (ref 6.5–8.1)

## 2015-06-08 LAB — RAPID URINE DRUG SCREEN, HOSP PERFORMED
AMPHETAMINES: NOT DETECTED
BENZODIAZEPINES: NOT DETECTED
Barbiturates: NOT DETECTED
COCAINE: NOT DETECTED
OPIATES: NOT DETECTED
Tetrahydrocannabinol: POSITIVE — AB

## 2015-06-08 LAB — ETHANOL

## 2015-06-08 NOTE — ED Notes (Signed)
Pt here for help with ETOH abuse. [facility]. Reports marijuana abuse. Denies SI/HI. Denies detox symptoms

## 2015-06-08 NOTE — ED Provider Notes (Signed)
CSN: [id number]     Arrival date & time 06/08/15  1755 History   First MD Initiated Contact with Patient 06/08/15 1910     Chief Complaint  Patient presents with  . Alcohol Problem   HPI Pt presents to the ED for issues with alcohol abuse.  He went to [facility] and was sent to the ED.  No complaints of SI or HI.  He does have depression.  Last drink was this am.  He usually drinks every day and has been doing that for over a month. No seizures.  NO shaking. Past Medical History  Diagnosis Date  . Depression   . Anxiety   . Personality disorder    History reviewed. No pertinent past surgical history. No family history on file. Social History  Substance Use Topics  . Smoking status: Current Every Day Smoker -- 0.50 packs/day for 26 years    Types: Cigarettes  . Smokeless tobacco: Never Used  . Alcohol Use: No    Review of Systems  All other systems reviewed and are negative.     Allergies  Review of patient's allergies indicates no known allergies.  Home Medications   Prior to Admission medications   Medication Sig Start Date End Date Taking? Authorizing Provider  buPROPion (WELLBUTRIN SR) 150 MG 12 hr tablet Take 1 tablet (150 mg total) by mouth 2 (two) times daily. For depression 01/08/12 01/07/13  Sanjuana Kava, NP  citalopram (CELEXA) 10 MG tablet Take 1 tablet (10 mg total) by mouth daily. For depression 01/08/12 01/07/13  Sanjuana Kava, NP  topiramate (TOPAMAX) 100 MG tablet Take 100 mg at 8:00 am and  50 mg at Bedtime: For mood stabilization 01/08/12   Sanjuana Kava, NP   BP 122/77 mmHg  Pulse 80  Temp(Src) 98.3 F (36.8 C) (Oral)  Resp 18  Ht  (1.778 m)  Wt 175 lb (79.379 kg)  BMI 25.11 kg/m2  SpO2 95% Physical Exam  Constitutional: He appears well-developed and well-nourished. No distress.  HENT:  Head: Normocephalic and atraumatic.  Right Ear: External ear normal.  Left Ear: External ear normal.  Eyes: Conjunctivae are normal. Right eye exhibits no  discharge. Left eye exhibits no discharge. No scleral icterus.  Neck: Neck supple. No tracheal deviation present.  Cardiovascular: Normal rate, regular rhythm and intact distal pulses.   Pulmonary/Chest: Effort normal and breath sounds normal. No stridor. No respiratory distress. He has no wheezes. He has no rales.  Abdominal: Soft. Bowel sounds are normal. He exhibits no distension. There is no tenderness. There is no rebound and no guarding.  Musculoskeletal: He exhibits no edema or tenderness.  Neurological: He is alert. He has normal strength. No cranial nerve deficit (no facial droop, extraocular movements intact, no slurred speech) or sensory deficit. He exhibits normal muscle tone. He displays no seizure activity. Coordination normal.  Skin: Skin is warm and dry. No rash noted.  Psychiatric: He has a normal mood and affect.  Nursing note and vitals reviewed.   ED Course  Procedures (including critical care time) Labs Review Labs Reviewed  COMPREHENSIVE METABOLIC PANEL - Abnormal; Notable for the following:    Chloride 98 (*)    Glucose, Bld 108 (*)    All other components within normal limits  URINE RAPID DRUG SCREEN, HOSP PERFORMED - Abnormal; Notable for the following:    Tetrahydrocannabinol POSITIVE (*)    All other components within normal limits  ETHANOL  CBC  MDM   Final diagnoses:  Alcohol abuse    Patient is not demonstrating any signs of withdrawal. His laboratory tests are unremarkable. Patient's test results were sent over to an outpatient treatment facility. Patient will be transported there for treatment of his alcohol abuse    Linwood DibblesJon Chaske Paskett, MD 06/08/15 2145

## 2015-06-08 NOTE — ED Notes (Signed)
Pt states his last drink was at 0100 on 06/08/15. Stated he had "4 four loko and a couple beers". Also stated that he "smoked some marijuana at the same time". Pt comfortable and in no visible distress. Denies SI/HI.

## 2015-06-08 NOTE — ED Notes (Signed)
[facility] here for transport to [facility].

## 2015-06-08 NOTE — Discharge Instructions (Signed)

## 2015-06-08 NOTE — ED Notes (Signed)
Attempted to call for room placement x 1 with no response.

## 2015-06-08 NOTE — Care Management (Signed)
ED CM met with to explained the [facility] does not have a detox program. Provided patient with names of the 2 agencies that provide detox for the uninsured.patients.  Offered to call to check on bed availability. Contacted [facility] in [place] a bed is available, need a copy of patient's most recent labs. Discussed with patient, patient is agreeable, had patient sign a release of Medical Information form. CM Faxed copy of lab results to [phone], fax confirmation received. Updated Dr. Tomi Bamberger and Gretta Cool RN on Pod A.  Patient will be transported to [facility] by [employer].

## 2015-07-23 ENCOUNTER — Inpatient Hospital Stay
Admit: 2015-07-23 | Discharge: 2015-07-28 | DRG: 885 | Disposition: A | Discharge status: Home / Self Care | Payer: No Typology Code available for payment source | Source: Intra-hospital | Attending: Psychiatry | Admitting: Psychiatry

## 2015-07-23 ENCOUNTER — Emergency Department
Admission: EM | Admit: 2015-07-23 | Discharge: 2015-07-23 | Disposition: A | Discharge status: Other Acute Inpatient Hospital | Payer: Federal, State, Local not specified - Other | Attending: Emergency Medicine | Admitting: Emergency Medicine

## 2015-07-23 DIAGNOSIS — F332 Major depressive disorder, recurrent severe without psychotic features: Principal | ICD-10-CM | POA: Diagnosis present

## 2015-07-23 DIAGNOSIS — Y907 Blood alcohol level of 200-239 mg/100 ml: Secondary | ICD-10-CM | POA: Diagnosis present

## 2015-07-23 DIAGNOSIS — F419 Anxiety disorder, unspecified: Secondary | ICD-10-CM | POA: Diagnosis present

## 2015-07-23 DIAGNOSIS — Y9289 Other specified places as the place of occurrence of the external cause: Secondary | ICD-10-CM | POA: Insufficient documentation

## 2015-07-23 DIAGNOSIS — F10120 Alcohol abuse with intoxication, uncomplicated: Secondary | ICD-10-CM | POA: Insufficient documentation

## 2015-07-23 DIAGNOSIS — Z915 Personal history of self-harm: Secondary | ICD-10-CM | POA: Diagnosis not present

## 2015-07-23 DIAGNOSIS — Z818 Family history of other mental and behavioral disorders: Secondary | ICD-10-CM

## 2015-07-23 DIAGNOSIS — R45850 Homicidal ideations: Secondary | ICD-10-CM | POA: Diagnosis present

## 2015-07-23 DIAGNOSIS — R45851 Suicidal ideations: Secondary | ICD-10-CM | POA: Diagnosis present

## 2015-07-23 DIAGNOSIS — F603 Borderline personality disorder: Secondary | ICD-10-CM | POA: Diagnosis present

## 2015-07-23 DIAGNOSIS — F1010 Alcohol abuse, uncomplicated: Secondary | ICD-10-CM | POA: Diagnosis present

## 2015-07-23 DIAGNOSIS — F17210 Nicotine dependence, cigarettes, uncomplicated: Secondary | ICD-10-CM | POA: Diagnosis present

## 2015-07-23 DIAGNOSIS — Y9389 Activity, other specified: Secondary | ICD-10-CM | POA: Insufficient documentation

## 2015-07-23 DIAGNOSIS — X788XXA Intentional self-harm by other sharp object, initial encounter: Secondary | ICD-10-CM | POA: Insufficient documentation

## 2015-07-23 DIAGNOSIS — S30811A Abrasion of abdominal wall, initial encounter: Secondary | ICD-10-CM | POA: Insufficient documentation

## 2015-07-23 DIAGNOSIS — Z62810 Personal history of physical and sexual abuse in childhood: Secondary | ICD-10-CM | POA: Diagnosis present

## 2015-07-23 DIAGNOSIS — F1290 Cannabis use, unspecified, uncomplicated: Secondary | ICD-10-CM | POA: Diagnosis present

## 2015-07-23 DIAGNOSIS — F1210 Cannabis abuse, uncomplicated: Secondary | ICD-10-CM | POA: Diagnosis present

## 2015-07-23 DIAGNOSIS — F10229 Alcohol dependence with intoxication, unspecified: Secondary | ICD-10-CM | POA: Diagnosis present

## 2015-07-23 DIAGNOSIS — Y998 Other external cause status: Secondary | ICD-10-CM | POA: Insufficient documentation

## 2015-07-23 DIAGNOSIS — Z7289 Other problems related to lifestyle: Secondary | ICD-10-CM

## 2015-07-23 DIAGNOSIS — E8881 Metabolic syndrome: Secondary | ICD-10-CM | POA: Diagnosis present

## 2015-07-23 DIAGNOSIS — G4700 Insomnia, unspecified: Secondary | ICD-10-CM | POA: Diagnosis present

## 2015-07-23 DIAGNOSIS — Z590 Homelessness: Secondary | ICD-10-CM

## 2015-07-23 DIAGNOSIS — F10129 Alcohol abuse with intoxication, unspecified: Secondary | ICD-10-CM | POA: Diagnosis present

## 2015-07-23 DIAGNOSIS — F329 Major depressive disorder, single episode, unspecified: Secondary | ICD-10-CM | POA: Diagnosis present

## 2015-07-23 DIAGNOSIS — S40812A Abrasion of left upper arm, initial encounter: Secondary | ICD-10-CM | POA: Insufficient documentation

## 2015-07-23 DIAGNOSIS — F10920 Alcohol use, unspecified with intoxication, uncomplicated: Secondary | ICD-10-CM

## 2015-07-23 DIAGNOSIS — S20319A Abrasion of unspecified front wall of thorax, initial encounter: Secondary | ICD-10-CM | POA: Insufficient documentation

## 2015-07-23 LAB — ETHANOL: ALCOHOL ETHYL (B): 203 mg/dL — AB (ref <5)

## 2015-07-23 LAB — COMPREHENSIVE METABOLIC PANEL
ALK PHOS: 74 U/L (ref 38–126)
ALT: 24 U/L (ref 17–63)
ANION GAP: 8 (ref 5–15)
AST: 25 U/L (ref 15–41)
Albumin: 4.6 g/dL (ref 3.5–5.0)
BUN: 12 mg/dL (ref 6–20)
CALCIUM: 9 mg/dL (ref 8.9–10.3)
CO2: 24 mmol/L (ref 22–32)
CREATININE: 1.03 mg/dL (ref 0.61–1.24)
Chloride: 109 mmol/L (ref 101–111)
Glucose, Bld: 107 mg/dL — ABNORMAL HIGH (ref 65–99)
Potassium: 3.8 mmol/L (ref 3.5–5.1)
SODIUM: 141 mmol/L (ref 135–145)
Total Bilirubin: 0.4 mg/dL (ref 0.3–1.2)
Total Protein: 8 g/dL (ref 6.5–8.1)

## 2015-07-23 LAB — CBC WITH DIFFERENTIAL/PLATELET
Basophils Absolute: 0.1 10*3/uL (ref 0–0.1)
Basophils Relative: 1 %
EOS ABS: 0.3 10*3/uL (ref 0–0.7)
EOS PCT: 4 %
HCT: 48.4 % (ref 40.0–52.0)
HEMOGLOBIN: 16.2 g/dL (ref 13.0–18.0)
LYMPHS ABS: 1.9 10*3/uL (ref 1.0–3.6)
LYMPHS PCT: 25 %
MCH: 33.2 pg (ref 26.0–34.0)
MCHC: 33.6 g/dL (ref 32.0–36.0)
MCV: 98.9 fL (ref 80.0–100.0)
MONOS PCT: 9 %
Monocytes Absolute: 0.7 10*3/uL (ref 0.2–1.0)
Neutro Abs: 4.7 10*3/uL (ref 1.4–6.5)
Neutrophils Relative %: 61 %
PLATELETS: 224 10*3/uL (ref 150–440)
RBC: 4.89 MIL/uL (ref 4.40–5.90)
RDW: 14.6 % — ABNORMAL HIGH (ref 11.5–14.5)
WBC: 7.6 10*3/uL (ref 3.8–10.6)

## 2015-07-23 LAB — ACETAMINOPHEN LEVEL

## 2015-07-23 LAB — SALICYLATE LEVEL

## 2015-07-23 LAB — URINE DRUG SCREEN, QUALITATIVE (ARMC ONLY)
Amphetamines, Ur Screen: NOT DETECTED
BARBITURATES, UR SCREEN: NOT DETECTED
BENZODIAZEPINE, UR SCRN: NOT DETECTED
CANNABINOID 50 NG, UR ~~LOC~~: POSITIVE — AB
Cocaine Metabolite,Ur ~~LOC~~: NOT DETECTED
MDMA (Ecstasy)Ur Screen: NOT DETECTED
Methadone Scn, Ur: NOT DETECTED
OPIATE, UR SCREEN: NOT DETECTED
PHENCYCLIDINE (PCP) UR S: NOT DETECTED
Tricyclic, Ur Screen: NOT DETECTED

## 2015-07-23 MED ORDER — LORAZEPAM 2 MG/ML IJ SOLN
2.0000 mg | Freq: Once | INTRAMUSCULAR | Status: AC
  Administered 2015-07-23: 2 mg via INTRAMUSCULAR

## 2015-07-23 MED ORDER — LORAZEPAM 1 MG PO TABS
1.0000 mg | ORAL_TABLET | ORAL | Status: DC | PRN
  Administered 2015-07-23: 1 mg via ORAL
  Filled 2015-07-23: qty 1

## 2015-07-23 MED ORDER — RISPERIDONE 1 MG PO TABS
0.5000 mg | ORAL_TABLET | Freq: Every day | ORAL | Status: DC
  Administered 2015-07-23 – 2015-07-25 (×2): 0.5 mg via ORAL
  Filled 2015-07-23 (×2): qty 1

## 2015-07-23 MED ORDER — LORAZEPAM 2 MG/ML IJ SOLN
INTRAMUSCULAR | Status: AC
  Administered 2015-07-23: 2 mg via INTRAMUSCULAR
  Filled 2015-07-23: qty 1

## 2015-07-23 MED ORDER — TRIPLE ANTIBIOTIC 3.5-400-5000 EX OINT
TOPICAL_OINTMENT | CUTANEOUS | Status: DC | PRN
  Administered 2015-07-23: 1 via TOPICAL
  Filled 2015-07-23: qty 4

## 2015-07-23 MED ORDER — VITAMIN B-1 100 MG PO TABS
100.0000 mg | ORAL_TABLET | Freq: Every day | ORAL | Status: DC
  Administered 2015-07-23 – 2015-07-24 (×2): 100 mg via ORAL
  Filled 2015-07-23 (×3): qty 1

## 2015-07-23 MED ORDER — FOLIC ACID 1 MG PO TABS
1.0000 mg | ORAL_TABLET | Freq: Every day | ORAL | Status: DC
  Administered 2015-07-23 – 2015-07-28 (×6): 1 mg via ORAL
  Filled 2015-07-23 (×6): qty 1

## 2015-07-23 MED ORDER — CITALOPRAM HYDROBROMIDE 20 MG PO TABS
20.0000 mg | ORAL_TABLET | Freq: Every day | ORAL | Status: DC
  Administered 2015-07-23 – 2015-07-27 (×5): 20 mg via ORAL
  Filled 2015-07-23 (×5): qty 1

## 2015-07-23 NOTE — ED Notes (Signed)

## 2015-07-23 NOTE — ED Notes (Signed)
BEHAVIORAL HEALTH ROUNDING Patient sleeping: Yes.   Patient alert and oriented: eyes closed  Appears asleep Behavior appropriate: Yes.  ; If no, describe:  Nutrition and fluids offered: Yes  Toileting and hygiene offered: sleeping Sitter present: q 15 minute observations and security monitoring Law enforcement present: yes  ODS 

## 2015-07-23 NOTE — ED Notes (Signed)
[facility]  [facility] when bed/staff become available

## 2015-07-23 NOTE — Consult Note (Signed)
[facility] Face-to-Face Psychiatry Consult   Reason for Consult:  Depression and suicidal ideations Referring Physician:  ED P  Patient Identification: Derek Weaver MRN:  [id number] Principal Diagnosis: Major depressive disorder recurrent severe without psychotic features                                      Alcohol abuse with withdrawal Diagnosis:   Patient Active Problem List   Diagnosis Date Noted  . Major depression ([place]) [F32.9] 01/04/2012  . Alcohol abuse [F10.10] 12/29/2011  . 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 Borderline behavior 08/02/2011  . Cannabis abuse, uncomplicated [P59.16] 38/46/6599    Total Time spent with patient: 1 hour  Subjective:   Derek Weaver is a 54 y.o. male patient presenting to the ED by [facility] with self-inflicted cuts to his abdomen and arms as well as ETOH intoxication.   HPI:    Most of the history was obtained from the patient as well as review of his chart. Patient has history of borderline personality as well as major depression the past. He reported that he has been feeling depressed for a long time as he has been noncompliant with his medication and has not taken any medication for the past 1 year. He reported that he used [facility] up [facility] the market in [place]. His previous medications include Celexa and Risperdal and Neurontin. Patient reported that he was having conflict at the homeless shelter with the staff member. He reported that the staff member has no respect. He has homicidal thoughts and wanting to hurt the staff member. Patient also reported that he has been drinking regularly and has been drinking a lot. His last drink was before presentation to the hospital. Patient appears with a flushed face but he currently denied having any withdrawal symptoms. He also has been using cannabis on a regular basis. Patient is unable to contract for safety at this  time.  Past Psychiatric History:   Patient has history of borderline personality disorder depression as well as substance induced organic mood disorder. He has long history of previous psychiatric hospitalization. He  has been admitted multiple times in the past. He reported that he has been noncompliant with his medications and was following at the market in [place]. His previous medications include Celexa Risperdal and Neurontin.  Risk to Self: Suicidal Ideation: No Suicidal Intent: No Is patient at risk for suicide?: No Suicidal Plan?: No Access to Means: Yes Specify Access to Suicidal Means: Pt has access to knives What has been your use of drugs/alcohol within the last 12 months?: ETOH/cocaine/marijuana How many times?: 1 Other Self Harm Risks: Cutting Triggers for Past Attempts: Other personal contacts Intentional Self Injurious Behavior: Cutting Comment - Self Injurious Behavior: Pt uses knife to make superficial cuts Risk to Others: Homicidal Ideation: Yes-Currently Present Thoughts of Harm to Others: Yes-Currently Present Comment - Thoughts of Harm to Others: Pt reports thoughts of harming another person at the homeless shelter Current Homicidal Intent: No Current Homicidal Plan: No Access to Homicidal Means: No Identified Victim: homeless shelter staff History of harm to others?: No Assessment of Violence: None Noted Violent Behavior Description: None reported Does patient have access to weapons?: No Criminal Charges Pending?: No Does patient have a court date: No Prior Inpatient Therapy: Prior Inpatient Therapy: Yes Prior Therapy Dates: 2013 Prior Therapy  Facilty/Provider(s): [facility] Reason for Treatment: depression Prior Outpatient Therapy: Prior Outpatient Therapy: No Prior Therapy Dates: N/A Prior Therapy Facilty/Provider(s): N/A Reason for Treatment: N/A Does patient have an ACCT team?: No Does patient have Intensive In-House Services?  : No Does patient  have [facility] services? : No Does patient have P4CC services?: No  Past Medical History:  Past Medical History  Diagnosis Date  . Depression   . Anxiety   . Personality disorder    No past surgical history on file. Family History: No family history on file. Family Psychiatric  History:  Social History:  History  Alcohol Use No     History  Drug Use  . 1.00 per week  . Special: Marijuana    Social History   Social History  . Marital Status: Single    Spouse Name: N/A  . Number of Children: N/A  . Years of Education: N/A   Social History Main Topics  . Smoking status: Current Every Day Smoker -- 0.50 packs/day for 26 years    Types: Cigarettes  . Smokeless tobacco: Never Used  . Alcohol Use: No  . Drug Use: 1.00 per week    Special: Marijuana  . Sexual Activity: Not on file   Other Topics Concern  . Not on file   Social History Narrative   Additional Social History:    History of alcohol / drug use?: Yes (Pt has hx of ETOH and marijuana use)                     Allergies:  No Known Allergies  Labs:  Results for orders placed or performed during the hospital encounter of 07/23/15 (from the past 48 hour(s))  Urine Drug Screen, Qualitative ([place] only)     Status: Abnormal   Collection Time: 07/23/15 12:38 AM  Result Value Ref Range   Tricyclic, Ur Screen NONE DETECTED NONE DETECTED   Amphetamines, Ur Screen NONE DETECTED NONE DETECTED   MDMA (Ecstasy)Ur Screen NONE DETECTED NONE DETECTED   Cocaine Metabolite,Ur [place] NONE DETECTED NONE DETECTED   Opiate, Ur Screen NONE DETECTED NONE DETECTED   Phencyclidine (PCP) Ur S NONE DETECTED NONE DETECTED   Cannabinoid 50 Ng, Ur [place] POSITIVE (A) NONE DETECTED   Barbiturates, Ur Screen NONE DETECTED NONE DETECTED   Benzodiazepine, Ur Scrn NONE DETECTED NONE DETECTED   Methadone Scn, Ur NONE DETECTED NONE DETECTED    Comment: (NOTE) 735  Tricyclics, urine               Cutoff 1000 ng/mL 200  Amphetamines, urine              Cutoff 1000 ng/mL 300  MDMA (Ecstasy), urine           Cutoff 500 ng/mL 400  Cocaine Metabolite, urine       Cutoff 300 ng/mL 500  Opiate, urine                   Cutoff 300 ng/mL 600  Phencyclidine (PCP), urine      Cutoff 25 ng/mL 700  Cannabinoid, urine              Cutoff 50 ng/mL 800  Barbiturates, urine             Cutoff 200 ng/mL 900  Benzodiazepine, urine           Cutoff 200 ng/mL 1000 Methadone, urine  Cutoff 300 ng/mL 1100 1200 The urine drug screen provides only a preliminary, unconfirmed 1300 analytical test result and should not be used for non-medical 1400 purposes. Clinical consideration and professional judgment should 1500 be applied to any positive drug screen result due to possible 1600 interfering substances. A more specific alternate chemical method 1700 must be used in order to obtain a confirmed analytical result.  1800 Gas chromato graphy / mass spectrometry (GC/MS) is the preferred 1900 confirmatory method.   CBC with Differential     Status: Abnormal   Collection Time: 07/23/15 12:49 AM  Result Value Ref Range   WBC 7.6 3.8 - 10.6 K/uL   RBC 4.89 4.40 - 5.90 MIL/uL   Hemoglobin 16.2 13.0 - 18.0 g/dL   HCT 48.4 40.0 - 52.0 %   MCV 98.9 80.0 - 100.0 fL   MCH 33.2 26.0 - 34.0 pg   MCHC 33.6 32.0 - 36.0 g/dL   RDW 14.6 (H) 11.5 - 14.5 %   Platelets 224 150 - 440 K/uL   Neutrophils Relative % 61 %   Neutro Abs 4.7 1.4 - 6.5 K/uL   Lymphocytes Relative 25 %   Lymphs Abs 1.9 1.0 - 3.6 K/uL   Monocytes Relative 9 %   Monocytes Absolute 0.7 0.2 - 1.0 K/uL   Eosinophils Relative 4 %   Eosinophils Absolute 0.3 0 - 0.7 K/uL   Basophils Relative 1 %   Basophils Absolute 0.1 0 - 0.1 K/uL  Comprehensive metabolic panel     Status: Abnormal   Collection Time: 07/23/15 12:49 AM  Result Value Ref Range   Sodium 141 135 - 145 mmol/L   Potassium 3.8 3.5 - 5.1 mmol/L   Chloride 109 101 - 111 mmol/L   CO2 24 22 - 32 mmol/L   Glucose, Bld  107 (H) 65 - 99 mg/dL   BUN 12 6 - 20 mg/dL   Creatinine, Ser 1.03 0.61 - 1.24 mg/dL   Calcium 9.0 8.9 - 10.3 mg/dL   Total Protein 8.0 6.5 - 8.1 g/dL   Albumin 4.6 3.5 - 5.0 g/dL   AST 25 15 - 41 U/L   ALT 24 17 - 63 U/L   Alkaline Phosphatase 74 38 - 126 U/L   Total Bilirubin 0.4 0.3 - 1.2 mg/dL   GFR calc non Af Amer >60 >60 mL/min   GFR calc Af Amer >60 >60 mL/min    Comment: (NOTE) The eGFR has been calculated using the CKD EPI equation. This calculation has not been validated in all clinical situations. eGFR's persistently <60 mL/min signify possible Chronic Kidney Disease.    Anion gap 8 5 - 15  Ethanol     Status: Abnormal   Collection Time: 07/23/15 12:49 AM  Result Value Ref Range   Alcohol, Ethyl (B) 203 (H) <5 mg/dL    Comment:        LOWEST DETECTABLE LIMIT FOR SERUM ALCOHOL IS 5 mg/dL FOR MEDICAL PURPOSES ONLY   Acetaminophen level     Status: Abnormal   Collection Time: 07/23/15 12:49 AM  Result Value Ref Range   Acetaminophen (Tylenol), Serum <10 (L) 10 - 30 ug/mL    Comment:        THERAPEUTIC CONCENTRATIONS VARY SIGNIFICANTLY. A RANGE OF 10-30 ug/mL MAY BE AN EFFECTIVE CONCENTRATION FOR MANY PATIENTS. HOWEVER, SOME ARE BEST TREATED AT CONCENTRATIONS OUTSIDE THIS RANGE. ACETAMINOPHEN CONCENTRATIONS >150 ug/mL AT 4 HOURS AFTER INGESTION AND >50 ug/mL AT 12 HOURS AFTER INGESTION ARE OFTEN ASSOCIATED  WITH TOXIC REACTIONS.   Salicylate level     Status: None   Collection Time: 07/23/15 12:49 AM  Result Value Ref Range   Salicylate Lvl <4.6 2.8 - 30.0 mg/dL    Current Facility-Administered Medications  Medication Dose Route Frequency Provider Last Rate Last Dose  . TRIPLE ANTIBIOTIC [phone] OINT   Topical PRN Paulette Blanch, MD   1 application at 19/01/22 0249   Current Outpatient Prescriptions  Medication Sig Dispense Refill  . buPROPion (WELLBUTRIN SR) 150 MG 12 hr tablet Take 1 tablet (150 mg total) by mouth 2 (two) times daily. For  depression 60 tablet 0  . citalopram (CELEXA) 10 MG tablet Take 1 tablet (10 mg total) by mouth daily. For depression 30 tablet 0  . topiramate (TOPAMAX) 100 MG tablet Take 100 mg at 8:00 am and  50 mg at Bedtime: For mood stabilization 45 tablet 0    Musculoskeletal: Strength & Muscle Tone: decreased Gait & Station: normal Patient leans: N/A  Psychiatric Specialty Exam: Review of Systems  Psychiatric/Behavioral: Positive for depression and suicidal ideas. The patient is nervous/anxious.     Blood pressure 133/79, pulse 89, temperature 98.3 F (36.8 C), temperature source Oral, resp. rate 18, weight 180 lb (81.647 kg), SpO2 99 %.Body mass index is 25.83 kg/(m^2).  General Appearance: Disheveled  Eye Sport and exercise psychologist::  Fair  Speech:  Slow  Volume:  Decreased  Mood:  Anxious and Depressed  Affect:  Congruent  Thought Process:  Disorganized  Orientation:  Full (Time, Place, and Person)  Thought Content:  WDL  Suicidal Thoughts:  No  Homicidal Thoughts:  Yes.  without intent/plan  Memory:  Immediate;   Fair  Judgement:  Fair  Insight:  Fair  Psychomotor Activity:  Decreased  Concentration:  Fair  Recall:  [employer] of Knowledge:Fair  Language: Fair  Akathisia:  No  Handed:  Right  AIMS (if indicated):     Assets:  Communication Skills Desire for Improvement Physical Health  ADL's:  Intact  Cognition: WNL  Sleep:      Treatment Plan Summary: Medication management  Disposition: Recommend psychiatric Inpatient admission when medically cleared.   Patient will be admitted to the inpatient psychiatric unit for stabilization and safety He will be restarted back on his psychotropic medications He will be given lorazepam for his alcohol withdrawal symptoms He will also be given thiamine and folic acid Patient will be monitored closely by the staff Thank you for allowing me to participate in the care of this patient      This note was generated in part or whole with voice  recognition software. Voice regonition is usually quite accurate but there are transcription errors that can and very often do occur. I apologize for any typographical errors that were not detected and corrected.    Rainey Pines, MD    07/23/2015 1:48 PM

## 2015-07-23 NOTE — ED Notes (Signed)
Pt alert, calm and cooperative.  Pt lying in bed, resp even and unlabored.  Suicide sitter Sarah EDT at bedside.

## 2015-07-23 NOTE — ED Notes (Signed)
Patient observed lying in bed with eyes closed  Even, unlabored respirations observed   NAD pt appears to be sleeping  I will continue to monitor along with every 15 minute visual observations and ongoing security camera monitoring    

## 2015-07-23 NOTE — ED Notes (Addendum)
Pt agitated with sitter.  Sitter advised to not verbally engage with pt but continue monitoring for safety.  Sitter verbalized understanding, pt remains in room, currently cooperative

## 2015-07-23 NOTE — ED Notes (Signed)
RN did lac care, antibiotic cream on lacs and dressed

## 2015-07-23 NOTE — ED Notes (Signed)
Lunch provided along with an extra drink  Pt observed with no unusual behavior  Appropriate to stimulation  No verbalized needs or concerns at this time  NAD assessed  Continue to monitor 

## 2015-07-23 NOTE — ED Notes (Signed)
BEHAVIORAL HEALTH ROUNDING Patient sleeping: No. Patient alert and oriented: yes Behavior appropriate: Yes.  ; If no, describe:  Nutrition and fluids offered: yes Toileting and hygiene offered: Yes  Sitter present: q15 minute observations and security  monitoring Law enforcement present: Yes  ODS  

## 2015-07-23 NOTE — BH Assessment (Signed)
Patient is to be [facility] by Dr. Garnetta BuddyFaheem.  Attending Physician will be Dr. Ardyth HarpsHernandez.   Patient [facility] 307[facility] Sue LushAndrea.   Intake Paper Work has been signed and placed on patient chart.  ER staff is aware of the admission Rivka Barbara(Glenda, ER Sect.; Dr. Lenard LancePaduchowski, ER MD; Danelle EarthlyNoel, Patient's Nurse & Albin Fellingarla Patient Access).

## 2015-07-23 NOTE — ED Notes (Signed)
ED BHU PLACEMENT JUSTIFICATION Is the patient under IVC or is there intent for IVC: Yes.   Is the patient medically cleared: Yes.   Is there vacancy in the ED BHU: Yes.   Is the population mix appropriate for patient: Yes.   Is the patient awaiting placement in inpatient or outpatient setting: No. Has the patient had a psychiatric consult:  Consult pending Survey of unit performed for contraband, proper placement and condition of furniture, tampering with fixtures in bathroom, shower, and each patient room: Yes.  ; Findings:  APPEARANCE/BEHAVIOR Calm and cooperative NEURO ASSESSMENT Orientation: oriented x3  Denies pain Hallucinations: No.None noted (Hallucinations) Speech: Normal Gait: normal RESPIRATORY ASSESSMENT even unlabored respirations  CARDIOVASCULAR ASSESSMENT Pulses equal  regular rate  Skin warm and dry GASTROINTESTINAL ASSESSMENT no GI complaint EXTREMITIES Full ROM PLAN OF CARE Provide calm/safe environment. Vital signs assessed twice daily. ED BHU Assessment once each 12-hour shift. Collaborate with intake RN daily or as condition indicates. Assure the ED provider has rounded once each shift. Provide and encourage hygiene. Provide redirection as needed. Assess for escalating behavior; address immediately and inform ED provider.  Assess family dynamic and appropriateness for visitation as needed: Yes.  ; If necessary, describe findings:  Educate the patient/family about BHU procedures/visitation: Yes.  ; If necessary, describe findings:   

## 2015-07-23 NOTE — ED Notes (Signed)
Pt has obvious cutting marks down all of left arm and chest .

## 2015-07-23 NOTE — ED Notes (Signed)
Patient getting more agitated with 1:1 sitters.  Patient able to be redirected by RN and is now sitting on bed with no complications.

## 2015-07-23 NOTE — ED Notes (Signed)
He has ambulated to the BR with a steady gait   NAD observed

## 2015-07-23 NOTE — ED Provider Notes (Signed)
[facility] Emergency Department Provider Note  ____________________________________________  Time seen: Approximately 12:37 AM  I have reviewed the triage vital signs and the nursing notes.   HISTORY  Chief Complaint Suicide Attempt    HPI Derek Weaver is a 54 y.o. male who presents to the ED via EMS with a chief complaint of suicidal ideation. Patient has a history of recurrent depression, personality disorder and alcohol abuse with prior attempts. He presents with multiple superficial lacerations to his arms and abdomen which were self-inflicted. Voices no medical complaints. Denies auditory or visual hallucinations; denies homicidal ideation.   Past Medical History  Diagnosis Date  . Depression   . Anxiety   . Personality disorder     Patient Active Problem List   Diagnosis Date Noted  . Major depression ([facility]) 01/04/2012  . Alcohol abuse 12/29/2011  . Psychoactive substance-induced organic mood disorder ([facility]) 12/27/2011    Class: Acute  . Borderline behavior 08/02/2011  . Cannabis abuse, uncomplicated 08/02/2011    No past surgical history on file.  Current Outpatient Rx  Name  Route  Sig  Dispense  Refill  . EXPIRED: buPROPion (WELLBUTRIN SR) 150 MG 12 hr tablet   Oral   Take 1 tablet (150 mg total) by mouth 2 (two) times daily. For depression   60 tablet   0   . EXPIRED: citalopram (CELEXA) 10 MG tablet   Oral   Take 1 tablet (10 mg total) by mouth daily. For depression   30 tablet   0   . topiramate (TOPAMAX) 100 MG tablet      Take 100 mg at 8:00 am and  50 mg at Bedtime: For mood stabilization   45 tablet   0     Allergies Review of patient's allergies indicates no known allergies.  No family history on file.  Social History Social History  Substance Use Topics  . Smoking status: Current Every Day Smoker -- 0.50 packs/day for 26 years    Types: Cigarettes  . Smokeless tobacco: Never Used  . Alcohol Use: No     Review of Systems Constitutional: No fever/chills Eyes: No visual changes. ENT: No sore throat. Cardiovascular: Denies chest pain. Respiratory: Denies shortness of breath. Gastrointestinal: No abdominal pain.  No nausea, no vomiting.  No diarrhea.  No constipation. Genitourinary: Negative for dysuria. Musculoskeletal: Negative for back pain. Skin: Negative for rash. Neurological: Negative for headaches, focal weakness or numbness. Psychiatric:Positive for depression with suicidal ideation.  10-point ROS otherwise negative.  ____________________________________________   PHYSICAL EXAM:  VITAL SIGNS: ED Triage Vitals  Enc Vitals Group     BP 07/23/15 0032 133/79 mmHg     Pulse Rate 07/23/15 0032 89     Resp 07/23/15 0032 18     Temp 07/23/15 0032 98.3 F (36.8 C)     Temp Source 07/23/15 0032 Oral     SpO2 07/23/15 0032 99 %     Weight 07/23/15 0034 180 lb (81.647 kg)     Height --      Head Cir --      Peak Flow --      Pain Score 07/23/15 0033 8     Pain Loc --      Pain Edu? --      Excl. in GC? --     Constitutional: Alert and oriented. Intoxicated and in no acute distress. Eyes: Conjunctivae are normal. PERRL. EOMI. Head: Atraumatic. Nose: No congestion/rhinnorhea. Mouth/Throat: Mucous membranes are moist.  Oropharynx non-erythematous. Neck: No stridor.   Cardiovascular: Normal rate, regular rhythm. Grossly normal heart sounds.  Good peripheral circulation. Respiratory: Normal respiratory effort.  No retractions. Lungs CTAB. Gastrointestinal: Soft and nontender. No distention. No abdominal bruits. No CVA tenderness. Musculoskeletal: No lower extremity tenderness nor edema.  No joint effusions. Neurologic:  Normal speech and language. No gross focal neurologic deficits are appreciated. No gait instability. Skin:  Skin is warm and dry. No rash noted. Multiple superficial lacerations to anterior chest/abdominal wall and left humerus which do not require  suture repair. Psychiatric: Mood and affect are flat. Speech and behavior are flat.  ____________________________________________   LABS (all labs ordered are listed, but only abnormal results are displayed)  Labs Reviewed  CBC WITH DIFFERENTIAL/PLATELET  COMPREHENSIVE METABOLIC PANEL  ETHANOL  ACETAMINOPHEN LEVEL  SALICYLATE LEVEL  URINE DRUG SCREEN, QUALITATIVE ([facility] ONLY)   ____________________________________________  EKG  None ____________________________________________  RADIOLOGY  None ____________________________________________   PROCEDURES  Procedure(s) performed: None  Critical Care performed: No  ____________________________________________   INITIAL IMPRESSION / ASSESSMENT AND PLAN / ED COURSE  Pertinent labs & imaging results that were available during my care of the patient were reviewed by me and considered in my medical decision making (see chart for details).  54 year old male with a history of depression with prior suicide attempts who presents with multiple self-inflicted injuries. Will place patient under involuntary commitment for his safety. Consult TTS and psychiatry for evaluation in the emergency department.  ----------------------------------------- 6:43 AM on 07/23/2015 -----------------------------------------  Asian sleeping in no acute distress. He is medically cleared pending psychiatry consult this morning. ____________________________________________   FINAL CLINICAL IMPRESSION(S) / ED DIAGNOSES  Final diagnoses:  Self-mutilation  Severe episode of recurrent major depressive disorder, without psychotic features (HCC)  Alcohol intoxication, uncomplicated ([facility])      Irean Hong, MD 07/23/15 [phone]

## 2015-07-23 NOTE — ED Notes (Signed)
Baxter HireKristen, RN [facility], wants ED to hold pt until after med pass at 2200, asked RN to have Cristela BlueMellisa, Charge RN call this RN, as quad is full

## 2015-07-23 NOTE — BH Assessment (Signed)
Assessment Note  Derek Weaver is a 54 y.o. male presenting to the ED by [facility] with self-inflicted cuts to his abdomen and arms as well as ETOH intoxication.   Pt denies trying to harm himself.  He reports he cuts himself as a way to cope with his feelings.  He reports ongoing conflict at the homeless shelter with a staff member.  He states that the staff member has no respect.  Pt expressed wanting to hurt the staff member.  Pt has a history of drug/alcohol abuse and a history of hospitalizations for drug abuse.  Diagnosis: Alcohol intoxication  Past Medical History:  Past Medical History  Diagnosis Date  . Depression   . Anxiety   . Personality disorder     No past surgical history on file.  Family History: No family history on file.  Social History:  reports that he has been smoking Cigarettes.  He has a 13 pack-year smoking history. He has never used smokeless tobacco. He reports that he uses illicit drugs (Marijuana) about once per week. He reports that he does not drink alcohol.  Additional Social History:  Alcohol / Drug Use History of alcohol / drug use?: Yes (Pt has hx of ETOH and marijuana use)  CIWA: CIWA-Ar BP: 133/79 mmHg Pulse Rate: 89 Nausea and Vomiting: no nausea and no vomiting Tactile Disturbances: none Tremor: no tremor Auditory Disturbances: not present Paroxysmal Sweats: no sweat visible Visual Disturbances: not present Anxiety: three Headache, Fullness in Head: none present Agitation: normal activity Orientation and Clouding of Sensorium: oriented and can do serial additions CIWA-Ar Total: 3 COWS:    Allergies: No Known Allergies  Home Medications:  (Not in a hospital admission)  OB/GYN Status:  No LMP for male patient.  General Assessment Data Location of Assessment: [facility] ED TTS Assessment: In system Is this a Tele or Face-to-Face Assessment?: Face-to-Face Is this an Initial Assessment or a Re-assessment for this encounter?: Initial  Assessment Marital status: Single Maiden name: N/a Is patient pregnant?: No Pregnancy Status: No Living Arrangements: Other (Comment) (homeless shelter) Can pt return to current living arrangement?: Yes Admission Status: Voluntary Is patient capable of signing voluntary admission?: Yes Referral Source: Other Insurance type: None  Medical Screening Exam [facility] Walk-in ONLY) Medical Exam completed: Yes  Crisis Care Plan Living Arrangements: Other (Comment) (homeless shelter) Legal Guardian: Other: (self) Name of Psychiatrist: None reported Name of Therapist: None reported  Education Status Is patient currently in school?: No Current Grade: N/A Highest grade of school patient has completed: N/A Name of school: N/A Contact person: N/a  Risk to self with the past 6 months Suicidal Ideation: No Has patient been a risk to self within the past 6 months prior to admission? : No Suicidal Intent: No Has patient had any suicidal intent within the past 6 months prior to admission? : No Is patient at risk for suicide?: No Suicidal Plan?: No Has patient had any suicidal plan within the past 6 months prior to admission? : No Access to Means: Yes Specify Access to Suicidal Means: Pt has access to knives What has been your use of drugs/alcohol within the last 12 months?: ETOH/cocaine/marijuana Previous Attempts/Gestures: Yes How many times?: 1 Other Self Harm Risks: Cutting Triggers for Past Attempts: Other personal contacts Intentional Self Injurious Behavior: Cutting Comment - Self Injurious Behavior: Pt uses knife to make superficial cuts Family Suicide History: Unknown Recent stressful life event(s): Conflict (Comment) (conflict at homeless shelter) Persecutory voices/beliefs?: No Depression: Yes Depression  Symptoms: Feeling worthless/self pity, Feeling angry/irritable Substance abuse history and/or treatment for substance abuse?: Yes Suicide prevention information given to  non-admitted patients: Not applicable  Risk to Others within the past 6 months Homicidal Ideation: Yes-Currently Present Does patient have any lifetime risk of violence toward others beyond the six months prior to admission? : No Thoughts of Harm to Others: Yes-Currently Present Comment - Thoughts of Harm to Others: Pt reports thoughts of harming another person at the homeless shelter Current Homicidal Intent: No Current Homicidal Plan: No Access to Homicidal Means: No Identified Victim: homeless shelter staff History of harm to others?: No Assessment of Violence: None Noted Violent Behavior Description: None reported Does patient have access to weapons?: No Criminal Charges Pending?: No Does patient have a court date: No Is patient on probation?: No  Psychosis Hallucinations: None noted Delusions: None noted  Mental Status Report Appearance/Hygiene: In scrubs Eye Contact: Fair Motor Activity: Unsteady Speech: Slurred Level of Consciousness: Drowsy Mood: Depressed, Worthless, low self-esteem, Irritable Affect: Depressed Anxiety Level: Minimal Thought Processes: Relevant Judgement: Partial Orientation: Person, Place, Time, Situation Obsessive Compulsive Thoughts/Behaviors: None  Cognitive Functioning Concentration: Normal Memory: Recent Impaired IQ: Average Insight: Poor Impulse Control: Poor Appetite: Good Weight Loss: 0 Weight Gain: 0 Sleep: No Change Vegetative Symptoms: None  ADLScreening [facility] Assessment Services) Patient's cognitive ability adequate to safely complete daily activities?: Yes Patient able to express need for assistance with ADLs?: Yes Independently performs ADLs?: Yes (appropriate for developmental age)  Prior Inpatient Therapy Prior Inpatient Therapy: Yes Prior Therapy Dates: 2013 [facility] Reason for Treatment: depression  Prior Outpatient Therapy Prior Outpatient Therapy: No Prior Therapy Dates:  N/A Prior Therapy Facilty/Provider(s): N/A Reason for Treatment: N/A Does patient have an ACCT team?: No Does patient have Intensive In-House Services?  : No Does patient have [facility] services? : No Does patient have P4CC services?: No  ADL Screening (condition at time of admission) Patient's cognitive ability adequate to safely complete daily activities?: Yes Patient able to express need for assistance with ADLs?: Yes Independently performs ADLs?: Yes (appropriate for developmental age)       Abuse/Neglect Assessment (Assessment to be complete while patient is alone) Physical Abuse: Denies Verbal Abuse: Denies Sexual Abuse: Denies Exploitation of patient/patient's resources: Denies Self-Neglect: Denies Values / Beliefs Cultural Requests During Hospitalization: None Spiritual Requests During Hospitalization: None Consults Spiritual Care Consult Needed: No Social Work Consult Needed: No      Additional Information 1:1 In Past 12 Months?: No CIRT Risk: No Elopement Risk: No     Disposition:  Disposition Initial Assessment Completed for this Encounter: Yes Disposition of Patient: Other dispositions Other disposition(s): Other (Comment) (Psych MD consult)  On Site Evaluation by:   Reviewed with Physician:    Yohana Bartha C Adelee Hannula 07/23/2015 1:46 AM

## 2015-07-23 NOTE — ED Notes (Signed)
Supper provided along with an extra drink  Pt observed with no unusual behavior  Appropriate to stimulation  No verbalized needs or concerns at this time  NAD assessed  Continue to monitor 

## 2015-07-23 NOTE — ED Notes (Signed)
Breakfast provided  Pt observed with no unusual behavior  Appropriate to stimulation  No verbalized needs or concerns at this time  NAD assessed  Continue to monitor 

## 2015-07-23 NOTE — ED Notes (Signed)
Pt in with multiple lacerations to arms and abd.

## 2015-07-23 NOTE — ED Notes (Signed)
tts at bedside 

## 2015-07-23 NOTE — ED Notes (Signed)
Call to [facility] to transfer pt, Derek Weaver and Derek AreolaEric RN to call this RN back

## 2015-07-23 NOTE — ED Notes (Signed)
Charge: call report Called [phone]

## 2015-07-24 ENCOUNTER — Encounter: Payer: Self-pay | Admitting: Psychiatry

## 2015-07-24 MED ORDER — MAGNESIUM HYDROXIDE 400 MG/5ML PO SUSP: 30.0000 mL | Freq: Every day | ORAL | Status: DC | PRN

## 2015-07-24 MED ORDER — ACETAMINOPHEN 325 MG PO TABS: 650.0000 mg | ORAL_TABLET | Freq: Four times a day (QID) | ORAL | Status: DC | PRN

## 2015-07-24 MED ORDER — GABAPENTIN 300 MG PO CAPS
300.0000 mg | ORAL_CAPSULE | Freq: Two times a day (BID) | ORAL | Status: DC
  Administered 2015-07-24 – 2015-07-27 (×6): 300 mg via ORAL
  Filled 2015-07-24 (×6): qty 1

## 2015-07-24 MED ORDER — BACITRACIN-NEOMYCIN-POLYMYXIN OINTMENT TUBE
TOPICAL_OINTMENT | Freq: Three times a day (TID) | CUTANEOUS | Status: DC
  Administered 2015-07-24 – 2015-07-27 (×7): via TOPICAL
  Administered 2015-07-27 – 2015-07-28 (×2): 1 via TOPICAL
  Filled 2015-07-24: qty 15

## 2015-07-24 MED ORDER — VITAMIN B-1 100 MG PO TABS
100.0000 mg | ORAL_TABLET | Freq: Every day | ORAL | Status: DC
  Administered 2015-07-25 – 2015-07-28 (×4): 100 mg via ORAL
  Filled 2015-07-24 (×3): qty 1

## 2015-07-24 MED ORDER — ADULT MULTIVITAMIN W/MINERALS CH
1.0000 | ORAL_TABLET | Freq: Every day | ORAL | Status: DC
  Administered 2015-07-25 – 2015-07-28 (×4): 1 via ORAL
  Filled 2015-07-24 (×4): qty 1

## 2015-07-24 MED ORDER — LOPERAMIDE HCL 2 MG PO CAPS: 2.0000 mg | ORAL_CAPSULE | ORAL | Status: AC | PRN

## 2015-07-24 MED ORDER — ONDANSETRON 4 MG PO TBDP: 4.0000 mg | ORAL_TABLET | Freq: Four times a day (QID) | ORAL | Status: AC | PRN

## 2015-07-24 MED ORDER — LORAZEPAM 1 MG PO TABS: 1.0000 mg | ORAL_TABLET | Freq: Four times a day (QID) | ORAL | Status: AC | PRN

## 2015-07-24 MED ORDER — HYDROXYZINE HCL 25 MG PO TABS: 25.0000 mg | ORAL_TABLET | Freq: Four times a day (QID) | ORAL | Status: AC | PRN

## 2015-07-24 MED ORDER — THIAMINE HCL 100 MG/ML IJ SOLN
100.0000 mg | Freq: Once | INTRAMUSCULAR | Status: DC
  Filled 2015-07-24: qty 2

## 2015-07-24 MED ORDER — ALUM & MAG HYDROXIDE-SIMETH 200-200-20 MG/5ML PO SUSP: 30.0000 mL | ORAL | Status: DC | PRN

## 2015-07-24 NOTE — [facility] Suicide Risk Assessment (Signed)
[facility]   Nursing information obtained from:    chart as well as nursing staff Demographic factors:    Mr. Derek Weaver is a 54 year old Caucasian male currently unemployed and living in a homeless shelter. Current Mental Status:    see below Loss Factors:    unemployed, poor primary support, noncompliance with medications Historical Factors:    the patient has a history of 3 prior suicide attempts Risk Reduction Factors:    compliance with medications, stable housing and employment Total Time spent with patient: 1 hour Principal Problem: Major depression Diagnosis:   Patient Active Problem List   Diagnosis Date Noted  . Alcohol abuse with intoxication (HCC) [F10.129] 07/23/2015  . Major depression (HCC) [F32.9] 01/04/2012  . Alcohol abuse [F10.10] 12/29/2011  . Psychoactive substance-induced organic mood disorder (HCC) [Z32.99[F19.94, F06.30] 12/27/2011    Class: Acute  . Borderline behavior 08/02/2011  . Cannabis abuse, uncomplicated [F12.10] 08/02/2011     Continued Clinical Symptoms:  Alcohol Use Disorder Identification Test Final Score (AUDIT): 9 The "Alcohol Use Disorders Identification Test", Guidelines for Use in Primary Care, Second Edition.  World Science writerHealth Organization [facility](WHO). Score between 0-7:  no or low risk or alcohol related problems. Score between 8-15:  moderate risk of alcohol related problems. Score between 16-19:  high risk of alcohol related problems. Score 20 or above:  warrants further diagnostic evaluation for alcohol dependence and treatment.   CLINICAL FACTORS:   Depression:   Anhedonia Comorbid alcohol abuse/dependence Alcohol/Substance Abuse/Dependencies   Musculoskeletal: Strength & Muscle Tone: within normal limits Gait & Station: normal Patient leans: N/A  Psychiatric Specialty Exam: Physical Exam  Constitutional: He is oriented to person, place, and time. He appears well-developed and well-nourished.  Disheveled  HENT:   Head: Normocephalic and atraumatic.  Right Ear: External ear normal.  Left Ear: External ear normal.  Mouth/Throat: Oropharynx is clear and moist.  Eyes: Conjunctivae and EOM are normal. Pupils are equal, round, and reactive to light. Right eye exhibits no discharge. Left eye exhibits no discharge.  Neck: Normal range of motion. Neck supple. No JVD present. No tracheal deviation present. No thyromegaly present.  Cardiovascular: Normal rate, regular rhythm and normal heart sounds.  Exam reveals no gallop and no friction rub.   No murmur heard. Respiratory: Breath sounds normal. No respiratory distress. He has no wheezes. He has no rales. He exhibits no tenderness.  GI: Soft. Bowel sounds are normal. He exhibits no distension. There is no tenderness. There is no rebound and no guarding.  Musculoskeletal: Normal range of motion. He exhibits no edema or tenderness.  Lymphadenopathy:    He has no cervical adenopathy.  Neurological: He is alert and oriented to person, place, and time. He has normal reflexes. No cranial nerve deficit. Coordination normal.  Skin: No rash noted.  The patient has superficial lacerations with dried blood on both upper extremities with the left arm being worse than the right arm. He also has superficial lacerations to his torso but no active bleeding. No signs of infection.    Review of Systems  Constitutional: Negative.  Negative for fever, chills, weight loss and malaise/fatigue.  HENT: Positive for tinnitus. Negative for congestion, ear pain, hearing loss and nosebleeds.   Eyes: Negative.  Negative for blurred vision, double vision, photophobia and pain.  Respiratory: Negative.  Negative for cough, hemoptysis, sputum production and shortness of breath.   Cardiovascular: Negative for chest pain, palpitations and orthopnea.  Gastrointestinal: Negative.  Negative for heartburn, nausea, vomiting,  abdominal pain, diarrhea, constipation and blood in stool.  Genitourinary:  Negative for dysuria, urgency, frequency and hematuria.  Musculoskeletal: Negative for myalgias, back pain, joint pain and neck pain.  Skin: Negative for itching and rash.       The patient has multiple superficial lacerations on both upper extremities and his torso.. No signs of infection.  Neurological: Negative.  Negative for dizziness, tingling, tremors, sensory change, speech change, focal weakness, seizures and headaches.  Endo/Heme/Allergies: Negative.  Does not bruise/bleed easily.    Blood pressure 109/72, pulse 65, temperature 98.1 F (36.7 C), temperature source Oral, resp. rate 18, height  (1.778 m), weight 78.881 kg (173 lb 14.4 oz), SpO2 100 %.Body mass index is 24.95 kg/(m^2).  General Appearance: Disheveled  Eye Contact::  Good  Speech:  Clear and Coherent and Slow  Volume:  Decreased  Mood:  Depressed  Affect:  Depressed  Thought Process:  Goal Directed, Intact, Linear and Logical  Orientation:  Full (Time, Place, and Person)  Thought Content:  Negative  Suicidal Thoughts:  No  Homicidal Thoughts:  Yes.  without intent/plan  Memory:  Immediate;   Good Recent;   Good Remote;   Good  Judgement:  Fair  Insight:  Fair  Psychomotor Activity:  Normal  Concentration:  Good  Recall:  Good  Fund of Knowledge:Good  Language: Good  Akathisia:  Negative  Handed:  Right  AIMS (if indicated):     Assets:  Communication Skills Others:  None  Sleep:  Number of Hours: 8.15  Cognition: WNL  ADL's:  Intact     COGNITIVE FEATURES THAT CONTRIBUTE TO RISK:  None    SUICIDE RISK:   Moderate:  Frequent suicidal ideation with limited intensity, and duration, some specificity in terms of plans, no associated intent, good self-control, limited dysphoria/symptomatology, some risk factors present, and identifiable protective factors, including available and accessible social support.  The patient denies any access to guns.  The patient has multiple stressors including  comorbid substance abuse, unemployment and lack of housing. In addition, he is noncompliant with medications. His risk of harm to himself and others is elevated secondary to comorbid substance use and lack of compliance.   PLAN OF CARE:   Diagnosis: Major depressive disorder, recurrent, severe, without psychotic features R/O Bipolar Disorder Borderline personality disorder Cannabis use disorder, mild Alcohol use disorder, severe Severe: Unemployed,, homeless, financial problems, lack of primary support  Mr. Rosendahl is a 54 year old divorced Caucasian male with a long history of polysubstance use as well as recurrent major depression borderline personality who presents to the emergency room after he started superficially cutting himself in a homeless shelter. He did also endorse some homicidal thoughts towards a staff member at the homeless shelter. He denies any current active suicidal thoughts however.  Major depressive disorder, recurrent, severe, without psychotic features, borderline personality disorder: The patient will be restarted on Celexa 20 mg by mouth daily for anxiety and depression and Risperdal 0.5 mg by mouth nightly for mood stabilization. The patient is also on Neurontin 300 mg by mouth twice a day for mood stabilization. He does not have a prior diagnosis of bipolar that has been maintained on stabilizers as she has found helpful. There is no history of any psychosis. Will check vitamin B12 and folic acid. Will also check lipid panel and hemoglobin A1c if he is on Risperdal.  Cannabis use disorder, mild to moderate, alcohol use disorder, severe: The patient was placed on Ativan per CIWA as well  as multivitamin, thiamine, folic acid. Monitor for any alcohol withdrawal symptoms. He was encouraged and her residential substance abuse treatment program at the time of discharge. He was advised to abstain from alcohol and all illicit drugs as they may worsen mood symptoms.  Disposition:  The patient currently hasdaily living situation. It isn't clear whether not he can return shelter. Recommend referral to residential substance abuse treatment at time. He will also need to follow with an outpatient psychiatrist after being released from residential treatment.    Medical Decision Making:  Review of Psycho-Social Stressors (1), Review or order clinical lab tests (1), Established Problem, Worsening (2) and Review of Medication Regimen & Side Effects (2)  I certify that inpatient services furnished can reasonably be expected to improve the patient's condition.   KAPUR,AARTI KAMAL 07/24/2015, 11:00 AM

## 2015-07-24 NOTE — [facility] Group Notes (Signed)
[facility] Group Notes:  (Nursing/MHT/Case Management/Adjunct)  Date:  07/24/2015  Time:  8:55 AM  Type of Therapy:  Psychoeducational Skills  Participation Level:  Did Not Attend    Derek PikeJamila A Tyreisha Weaver 07/24/2015, 8:55 AM
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
## 2015-07-24 NOTE — Progress Notes (Signed)
Derek Weaver is a 54 y.o. male who presented to the ED via EMS with a chief complaint of suicidal ideation. Patient has a history of recurrent depression, personality disorder and alcohol abuse with prior attempts. He presents with multiple superficial lacerations to his arms and abdomen which were self-inflicted [er pt with a razor blade.  Voices no medical complaints. Pt forwards very little. Admitted to having thoughts of HI but denies them now. Denies SI and AVH. Pt was not forthcoming with drinking habits. Stated that he drank 3 bottles of wine before going to ED but would elaborate further. Alcohol level in ED was 203. Skin assessment completed. Pt had several healing lacerations across torso and left upper and lower arm. Arm was wrapped in ED. Stitches were not needed. Pt affect was depressed and interaction minimal but Pt was cooperative. Medications given as prescribed. Pt ate a snack and went to bed. Currently resting with eyes closed.

## 2015-07-24 NOTE — Tx Team (Signed)
Initial Interdisciplinary Treatment Plan   PATIENT STRESSORS: Financial difficulties living arrangments   PATIENT STRENGTHS: Ability for insight Capable of independent living Work skills   PROBLEM LIST: Problem List/Patient Goals Date to be addressed Date deferred Reason deferred Estimated date of resolution  Anxiety  07/24/15     depression 07/24/15                                                DISCHARGE CRITERIA:  Improved stabilization in mood, thinking, and/or behavior Motivation to continue treatment in a less acute level of care Verbal commitment to aftercare and medication compliance  PRELIMINARY DISCHARGE PLAN: Attend aftercare/continuing care group Outpatient therapy Placement in alternative living arrangements  PATIENT/FAMIILY INVOLVEMENT: This treatment plan has been presented to and reviewed with the patient, Derek Weaver, and/or family member, .  The patient and family have been given the opportunity to ask questions and make suggestions.  Ernesto RutherfordKristen Altin Sease 07/24/2015, 1:44 AM

## 2015-07-24 NOTE — Progress Notes (Signed)
Pt has been seclusive to his room except at meal times. Pt's mood and affect has been depressed . Dressing changed on left arm . Pt continues to have fleeting thoughts of suicide but is able to contract for safety.

## 2015-07-25 DIAGNOSIS — F332 Major depressive disorder, recurrent severe without psychotic features: Secondary | ICD-10-CM | POA: Diagnosis present

## 2015-07-25 NOTE — Plan of Care (Signed)
Problem: Alteration in mood Goal: LTG-Patient reports reduction in suicidal thoughts (Patient reports reduction in suicidal thoughts and is able to verbalize a safety plan for whenever patient is feeling suicidal)  Outcome: Progressing Patient SI/HI.
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
## 2015-07-25 NOTE — Progress Notes (Signed)
D: Pt denies SI/HI/AVH. Patient's affect is flat,sad, and guarded with information. Patient secludes to the room, appears less anxious.  A: Pt was offered support and encouragement. Pt was offered scheduled medications. Pt was encouraged to attend groups. Q 15 minute checks were done for safety.  R:Pt did not attend group, Pt is not compliant with medication. Pt not receptive to treatment, safety maintained on unit.

## 2015-07-25 NOTE — [facility] Group Notes (Addendum)
[facility] Group Notes:  (Nursing/MHT/Case Management/Adjunct)  Date:  07/25/2015  Time:  9:56 PM  Type of Therapy: Holiday Wrap-up Discussion  Participation Level:  Did Not Attend  Participation Quality:  Did Not Attend  Affect:  N/A  Cognitive:  N/A  Insight:  None  Engagement in Group:  Did Not Attend  Modes of Intervention:  Discussion  Summary of Progress/Problems:  Derek MorrowChelsea Weaver Derek Weaver 07/25/2015, 9:56 PM

## 2015-07-25 NOTE — Progress Notes (Signed)
Pt has been seclusive to his room except at meal times. Pt's mood and affect has been depressed . Dressing changed on left arm . Pt continues to have fleeting thoughts of suicide but is able to contract for safety.         

## 2015-07-26 DIAGNOSIS — F332 Major depressive disorder, recurrent severe without psychotic features: Principal | ICD-10-CM

## 2015-07-26 LAB — TSH: TSH: 1.459 u[IU]/mL (ref 0.350–4.500)

## 2015-07-26 LAB — LIPID PANEL
Cholesterol: 163 mg/dL (ref 0–200)
HDL: 58 mg/dL (ref >40)
LDL CALC: 64 mg/dL (ref 0–99)
Total CHOL/HDL Ratio: 2.8 RATIO
Triglycerides: 204 mg/dL — ABNORMAL HIGH (ref <150)
VLDL: 41 mg/dL — AB (ref 0–40)

## 2015-07-26 LAB — FOLATE: Folate: 37 ng/mL (ref >5.9)

## 2015-07-26 LAB — HEMOGLOBIN A1C: HEMOGLOBIN A1C: 4.9 % (ref 4.0–6.0)

## 2015-07-26 MED ORDER — RISPERIDONE 1 MG PO TABS
1.0000 mg | ORAL_TABLET | Freq: Every day | ORAL | Status: DC
Start: 2015-07-26 — End: 2015-07-27
  Administered 2015-07-26: 1 mg via ORAL
  Filled 2015-07-26: qty 1

## 2015-07-26 NOTE — Progress Notes (Addendum)
[facility] MD Progress Note  07/26/2015 12:45 PM XANE AMSDEN  MRN:  [id number]  Subjective:  Mr. Slane still appears very depressed. He secluded to his room. He does not participate in programming. There is no eye contact. He has passive suicidal thoughts that may be chronic. He denies any urges to cut. He wants to get out of the hospital as quickly as possible and start walking. He has no destination. He believes that he will not be allowed to return to the homeless shelter. He is not interested in any of the local programs available to substance users. He accepts medications. He denies any symptoms of alcohol withdrawal. Sleep and appetite are fair  Principal Problem: <principal problem not specified> Diagnosis:   Patient Active Problem List   Diagnosis Date Noted  . Severe episode of recurrent major depressive disorder, without psychotic features (HCC) [F33.2]   . Alcohol abuse with intoxication (HCC) [F10.129] 07/23/2015  . Major depression (HCC) [F32.9] 01/04/2012  . Alcohol abuse [F10.10] 12/29/2011  . 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 Borderline personality disorder [F60.3] 08/02/2011  . Cannabis abuse, uncomplicated [F12.10] 08/02/2011   Total Time spent with patient: 20 minutes  Past Psychiatric History: Depression, borderline personality disorder, substance abuse.  Past Medical History:  Past Medical History  Diagnosis Date  . Depression   . Anxiety   . Personality disorder    History reviewed. No pertinent past surgical history. Family History:  Family History  Problem Relation Age of Onset  . Depression Mother    Family Psychiatric  History: See H&P. Social History:  History  Alcohol Use  . 0.0 oz/week  . 0 Standard drinks or equivalent per week     History  Drug Use  . 1.00 per week  . Special: Marijuana    Social History   Social History  . Marital Status: Single    Spouse Name:  N/A  . Number of Children: N/A  . Years of Education: N/A   Social History Main Topics  . Smoking status: Current Every Day Smoker -- 0.50 packs/day for 26 years    Types: Cigarettes  . Smokeless tobacco: Never Used  . Alcohol Use: 0.0 oz/week    0 Standard drinks or equivalent per week  . Drug Use: 1.00 per week    Special: Marijuana  . Sexual Activity: Not Asked   Other Topics Concern  . None   Social History Narrative   Additional Social History:                         Sleep: Fair  Appetite:  Fair  Current Medications: Current Facility-Administered Medications  Medication Dose Route Frequency Provider Last Rate Last Dose  . acetaminophen (TYLENOL) tablet 650 mg  650 mg Oral Q6H PRN Darliss Ridgel, MD      . alum & mag hydroxide-simeth (MAALOX/MYLANTA) 200-200-20 MG/5ML suspension 30 mL  30 mL Oral Q4H PRN Darliss Ridgel, MD      . citalopram (CELEXA) tablet 20 mg  20 mg Oral Daily Brandy Hale, MD   20 mg at 07/26/15 1003  . folic acid (FOLVITE) tablet 1 mg  1 mg Oral Daily Brandy Hale, MD   1 mg at 07/26/15 1003  . gabapentin (NEURONTIN) capsule 300 mg  300 mg Oral BID Darliss Ridgel, MD   300 mg at 07/26/15 1003  . hydrOXYzine (ATARAX/VISTARIL) tablet 25 mg  25 mg Oral Q6H PRN Darliss Ridgel, MD      . loperamide (IMODIUM) capsule 2-4 mg  2-4 mg Oral PRN Darliss Ridgel, MD      . LORazepam (ATIVAN) tablet 1 mg  1 mg Oral Q6H PRN Darliss Ridgel, MD      . magnesium hydroxide (MILK OF MAGNESIA) suspension 30 mL  30 mL Oral Daily PRN Darliss Ridgel, MD      . multivitamin with minerals tablet 1 tablet  1 tablet Oral Daily Darliss Ridgel, MD   1 tablet at 07/26/15 1003  . neomycin-bacitracin-polymyxin (NEOSPORIN) ointment   Topical TID Darliss Ridgel, MD      . ondansetron (ZOFRAN-ODT) disintegrating tablet 4 mg  4 mg Oral Q6H PRN Darliss Ridgel, MD      . risperiDONE (RISPERDAL) tablet 0.5 mg  0.5 mg Oral QHS Brandy Hale, MD   0.5 mg at 07/25/15 2156  . thiamine (B-1)  injection 100 mg  100 mg Intramuscular Once Darliss Ridgel, MD   100 mg at 07/24/15 1306  . thiamine (VITAMIN B-1) tablet 100 mg  100 mg Oral Daily Darliss Ridgel, MD   100 mg at 07/26/15 1003    Lab Results: No results found for this or any previous visit (from the past 48 hour(s)).  Physical Findings: AIMS:  , ,  ,  ,    CIWA:  CIWA-Ar Total: 0 COWS:     Musculoskeletal: Strength & Muscle Tone: within normal limits Gait & Station: normal Patient leans: N/A  Psychiatric Specialty Exam: Review of Systems  All other systems reviewed and are negative.   Blood pressure 94/63, pulse 57, temperature 98.2 F (36.8 C), temperature source Oral, resp. rate 18, height  (1.778 m), weight 78.881 kg (173 lb 14.4 oz), SpO2 100 %.Body mass index is 24.95 kg/(m^2).  General Appearance: Disheveled  Eye Contact::  Minimal  Speech:  Slow  Volume:  Decreased  Mood:  Dysphoric  Affect:  Blunt  Thought Process:  Goal Directed  Orientation:  Full (Time, Place, and Person)  Thought Content:  WDL  Suicidal Thoughts:  Yes.  with intent/plan  Homicidal Thoughts:  No  Memory:  Immediate;   Fair Recent;   Fair Remote;   Fair  Judgement:  Fair  Insight:  Shallow  Psychomotor Activity:  Decreased  Concentration:  Fair  Recall:  [employer] of Knowledge:Fair  Language: Fair  Akathisia:  No  Handed:  Right  AIMS (if indicated):     Assets:  Communication Skills Desire for Improvement Physical Health Resilience  ADL's:  Intact  Cognition: WNL  Sleep:  Number of Hours: 8.15   Treatment Plan Summary: Daily contact with patient to assess and evaluate symptoms and progress in treatment and Medication management   Mr. Cervini is a 54 year old divorced Caucasian male with a long history of polysubstance use as well as recurrent major depression and borderline personality who presents to the emergency room after he started superficially cutting himself in a homeless shelter. He did also endorse  some homicidal thoughts towards a staff member at the homeless shelter.   1. Suicidal ideation. He still feels passively suicidal with thoughts of cutting but is able to contract for safety in the hospital. He denies homicidal ideation.  2. Major depressive disorder, recurrent, severe, without psychotic features, borderline personality disorder: The patient was restarted on Celexa 20 mg daily for anxiety and depression, and Risperdal 0.5 mg nightly  and Neurontin 300 mg twice a day for mood stabilization. We'll increase Risperdal to 1 mg. He does not have prior diagnosis of bipolar but has been maintained on mood stabilizers and has found it helpful. There is no history of any psychosis.  3. Metabolic syndrome. Vitamin B12, folic acid, lipid panel, TSH and hemoglobin A1c are pending.   4. Alcohol abuse. The patient is on CIWA protocol this is uncomplicated detox. Vital signs are stable.   5. Substance abuse. The patient is not interested in residential treatment program participation. He was advised to abstain from alcohol and all illicit drugs as they may worsen mood symptoms.  minimal.  6. Disposition: The patient currently is homeless. He is not allowed to return to the shelter. He will need to follow with an outpatient psychiatrist.    Kristine LineaJolanta Rohnan Bartleson 07/26/2015, 12:45 PM

## 2015-07-26 NOTE — Progress Notes (Signed)
Recreation Therapy Notes  INPATIENT RECREATION THERAPY ASSESSMENT  Patient Details Name: Derek Weaver MRN: [id number] DOB: November 18, 1960 Today's Date: 07/26/2015  Patient Stressors: Friends, Other (Comment) (Lack of supptive friends; home situation)  Coping Skills:   Isolate, Substance Abuse, Avoidance, Self-Injury, Exercise, Music  Personal Challenges: Anger, Communication, Concentration, Expressing Yourself, Relationships, Self-Esteem/Confidence, Social Interaction, Stress Management, Substance Abuse  Leisure Interests (2+):  Individual - Other (Comment) (Camping, hiking)  Awareness of Community Resources:  No  Community Resources:     Current Use:    If no, Barriers?:    Patient Strengths:  Primary school teacherGood worker, patient  Patient Identified Areas of Improvement:  Dealing with stress  Current Recreation Participation:  Camping  Patient Goal for Hospitalization:  "I don't have any. I don't want t obe here. I don't need help here."  [place]  County of Residence:  [place]   Current SI (including self-harm):  Yes ("I know how to deal with it.")  Current HI:  Yes  Consent to Intern Participation: N/A  Patient stated he would think about one-to-one therapy.  Jacquelynn CreeGreene,Bera Pinela M, LRT/CTRS 07/26/2015, 1:36 PM

## 2015-07-26 NOTE — [facility] Group Notes (Signed)
[facility] LCSW Aftercare Discharge Planning Group Note  07/26/2015 9:15 AM  Participation Quality: Did Not Attend. Patient invited to participate but declined.   Nevaeh Casillas F. Shishir Krantz, MSW, LCSWA, LCAS   

## 2015-07-26 NOTE — [facility] Group Notes (Signed)
[facility] LCSW Group Therapy   07/26/2015 1 pm  Type of Therapy: Group Therapy   Participation Level: Did Not Attend. Patient invited to participate but declined.    Babara Buffalo F. Lynsie Mcwatters, MSW, LCSWA, LCAS   

## 2015-07-26 NOTE — Progress Notes (Signed)
Recreation Therapy Notes  Date: 12.26.16 Time: 3:10 pm Location: Craft Room  Group Topic: Self-expression  Goal Area(s) Addresses:  Patient will identify one color per emotion listed on wheel. Patient will verbalize benefit of using art as a means of self-expression. Patient will verbalize one emotion experienced during session. Patient will be educated on other forms of self-expression.  Behavioral Response: Attentive, Interactive  Intervention: Emotion Wheel  Activity: Patients were given a worksheet with 7 different emotions. Patients were instructed to pick a color for each emotion.  Education: LRT educated patients on other forms of self-expression.  Education Outcome: Acknowledges education/In group clarification offered   Clinical Observations/Feedback: Patient completed activity by picking colors for each emotion. Patient contributed to group discussion by stating what colors he picked for certain emotions and why, how it felt to see his emotions in color, and what he was thinking about while he was coloring.  Jacquelynn CreeGreene,Ryle Buscemi M, LRT/CTRS 07/26/2015 4:37 PM

## 2015-07-26 NOTE — Progress Notes (Signed)
He is depressed & guarded.Denies suicidal & self harm ideation.Did not attend groups.Compliant with medications.Passive interactions with peers.Visible in the milieu.Appetite good.

## 2015-07-26 NOTE — [facility] Group Notes (Signed)
[facility] Group Notes:  (Nursing/MHT/Case Management/Adjunct)  Date:  07/26/2015  Time:  10:13 PM  Type of Therapy:  Group Therapy  Participation Level:  Active  Participation Quality:  Appropriate  Affect:  Appropriate  Cognitive:  Appropriate  Insight:  Appropriate  Engagement in Group:  Engaged  Modes of Intervention:  Discussion  Summary of Progress/Problems:  Derek Weaver 07/26/2015, 10:13 PM

## 2015-07-26 NOTE — Plan of Care (Signed)
Problem: Diagnosis: Increased Risk For Suicide Attempt Goal: LTG-Patient Will Report Absence of Withdrawal Symptoms LTG (by discharge): Patient will report absence of withdrawal symptoms.  Outcome: Progressing Patient denied any withdrawal symptoms during his 0000 CIWA

## 2015-07-26 NOTE — Progress Notes (Signed)
D: Patient appears flat. He forwards little. States he doesn't want to be here. Denies SI/HI/AVH/ for me but stated SI for MD earlier in the day according MD notes. He also denies pain. Patient was aggressive towards other patient during medication pass. He was getting impatient and was yelling at the patient to hurry up and take her medication. He left and then eventually came back after she was done.  A: Medication was given with education. Encouragement was provided.   R: Patient was compliant with medication. Safety was maintained with 15 min checks.

## 2015-07-26 NOTE — Plan of Care (Signed)
Problem: Alteration in mood Goal: LTG-Patient reports reduction in suicidal thoughts (Patient reports reduction in suicidal thoughts and is able to verbalize a safety plan for whenever patient is feeling suicidal)  Outcome: Progressing Denies suicidal ideation.     

## 2015-07-26 NOTE — [facility] Group Notes (Signed)
[facility] Group Notes:  (Nursing/MHT/Case Management/Adjunct)  Date:  07/26/2015  Time:  12:35 PM  Type of Therapy:  Psychoeducational Skills  Participation Level:  Did Not Attend  Isabelle CourseWhitney R Jaasia Viglione 07/26/2015, 12:35 PM

## 2015-07-27 LAB — VITAMIN B12: VITAMIN B 12: 228 pg/mL (ref 180–914)

## 2015-07-27 MED ORDER — RISPERIDONE 1 MG PO TABS
2.0000 mg | ORAL_TABLET | Freq: Every day | ORAL | Status: DC
  Administered 2015-07-27: 2 mg via ORAL
  Filled 2015-07-27: qty 2

## 2015-07-27 MED ORDER — CITALOPRAM HYDROBROMIDE 20 MG PO TABS
40.0000 mg | ORAL_TABLET | Freq: Every day | ORAL | Status: DC
  Administered 2015-07-28: 40 mg via ORAL
  Filled 2015-07-27: qty 2

## 2015-07-27 MED ORDER — GABAPENTIN 400 MG PO CAPS: 400.0000 mg | ORAL_CAPSULE | Freq: Three times a day (TID) | ORAL | Status: AC

## 2015-07-27 MED ORDER — GABAPENTIN 400 MG PO CAPS
400.0000 mg | ORAL_CAPSULE | Freq: Three times a day (TID) | ORAL | Status: DC
  Administered 2015-07-27 – 2015-07-28 (×3): 400 mg via ORAL
  Filled 2015-07-27 (×3): qty 1

## 2015-07-27 MED ORDER — RISPERIDONE 2 MG PO TABS: 2.0000 mg | ORAL_TABLET | Freq: Every day | ORAL | Status: DC

## 2015-07-27 MED ORDER — RISPERIDONE 2 MG PO TABS: 2.0000 mg | ORAL_TABLET | Freq: Every day | ORAL | Status: AC

## 2015-07-27 MED ORDER — GABAPENTIN 400 MG PO CAPS: 400.0000 mg | ORAL_CAPSULE | Freq: Three times a day (TID) | ORAL | Status: DC

## 2015-07-27 MED ORDER — CITALOPRAM HYDROBROMIDE 40 MG PO TABS: 40.0000 mg | ORAL_TABLET | Freq: Every day | ORAL | Status: DC

## 2015-07-27 MED ORDER — CITALOPRAM HYDROBROMIDE 40 MG PO TABS: 40.0000 mg | ORAL_TABLET | Freq: Every day | ORAL | Status: AC

## 2015-07-27 NOTE — Progress Notes (Signed)
Recreation Therapy Notes  Date: 12.27.16 Time: 3:00 pm Location: Craft Room  Group Topic: Goal Setting  Goal Area(s) Addresses:  Patient will write at least one goal. Patient will write at least one obstacle.  Behavioral Response: Attentive, Interactive   Intervention: Recovery Goal Chart  Activity: Patients were instructed to make a Recovery Goal Chart including 3 goals towards their recovery, obstacles, the day they started working on their goals, and the day they achieved their goals.  Education: LRT educated patients on healthy ways they can celebrate reaching their goals.  Education Outcome: Acknowledges education/In group clarification offered  Clinical Observations/Feedback: Patient completed activity by writing goals and obstacles. Patient contributed to group discussion by stating how he can overcome his obstacles, and how he can celebrate reaching his goal in a healthy way.  Jacquelynn CreeGreene,Kamren Heintzelman M, LRT/CTRS 07/27/2015 4:38 PM
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
## 2015-07-27 NOTE — Progress Notes (Signed)
D: Patient appears flat. Denies SI/HI/AVH/ for me.He also denies pain. He has been seen all evening in the milieu watching TV. He has been interacting with other patients. Patient was able to tell me about the group he attended today with the recreational therapist and stated he was going to try to attend more tomorrow.  A: Medication was given with education. Encouragement was provided.  R: Patient was compliant with medication. Safety was maintained with 15 min checks. Patient attended wrap up group.

## 2015-07-27 NOTE — Plan of Care (Signed)
Problem: Ineffective individual coping Goal: STG-Increase in ability to manage activities of daily living Outcome: Progressing Working on Pharmacologistcoping skills

## 2015-07-27 NOTE — [facility] Group Notes (Signed)
[facility] Group Notes:  (Nursing/MHT/Case Management/Adjunct)  Date:  07/27/2015  Time:  2:31 PM  Type of Therapy:  Psychoeducational Skills  Participation Level:  Did Not Attend   Derek Weaver 07/27/2015, 2:31 PM

## 2015-07-27 NOTE — Progress Notes (Signed)
D: Patient stated slept good last night .Stated appetite is fair and energy level low. Stated concentration is poor. Stated on Depression scale 7 , hopeless 9 and anxiety 7 .( low 0-10 high) Denies suicidal  homicidal ideations  .  No auditory hallucinations  No pain concerns . Appropriate ADL'S. Interacting with peers and staff. Goal today Work on [employer] MaterialsCoping skills  And stay out of bed. Patient stated he is not sure if he can return rto the shelter due to his cutting on himself. Left arm no drainage slightly red at the sight. Dressing applied X2 A: Encourage patient participation with unit programming . Instruction  Given on  Medication , verbalize understanding. R: Voice no other concerns. Staff continue to monitor

## 2015-07-27 NOTE — [facility] Group Notes (Signed)
[facility] LCSW Group Therapy   07/27/2015 11:00am  Type of Therapy: Group Therapy   Participation Level: Did Not Attend. Patient invited to participate but declined.    Torra Pala F. Jakita Dutkiewicz, MSW, LCSWA, LCAS   

## 2015-07-27 NOTE — Plan of Care (Signed)
Problem: Alteration in mood; excessive anxiety as evidenced by: Goal: STG-Pt will report an absence of self-harm thoughts/actions (Patient will report an absence of self-harm thoughts or actions)  Outcome: Progressing Patient denies SI at this time.     

## 2015-07-28 MED ORDER — CYANOCOBALAMIN 1000 MCG/ML IJ SOLN
1000.0000 ug | Freq: Once | INTRAMUSCULAR | Status: AC
Start: 2015-07-28 — End: 2015-07-28
  Administered 2015-07-28: 1000 ug via INTRAMUSCULAR
  Filled 2015-07-28: qty 1
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
## 2015-07-28 NOTE — [facility] Suicide Risk Assessment (Signed)
[facility] INPATIENT:  Family/Significant Other Suicide Prevention Education  Suicide Prevention Education:  Patient Refusal for Family/Significant Other Suicide Prevention Education: The patient Derek Weaver has refused to provide written consent for family/significant other to be provided Family/Significant Other Suicide Prevention Education during admission and/or prior to discharge.  Physician notified.  SPE completed with pt and he was encouraged to share information provided in SPI pamphlet with support network, ask questions, and talk about any concerns relating to SPE. Pt denies SI/HI/AVH and verbalized understanding of information presented.    Sempra EnergyCandace L Kaelynne Christley MSW, LCSWA  07/28/2015, 2:55 PM
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
## 2015-07-28 NOTE — Progress Notes (Signed)
D:Patient aware of discharge this shift . Patient returning to shelter. Patient received all belonging locked up . Patient denies  Suicidal  And homicidal ideations  .  A: Writer instructed on discharge criteria  . Informed of 7 day supply  Of medication  FL2 and prescriptions  given to patient . Aware  Of follow up appointment . R: Patient left unit with no questions  Or concerns  Patient taking taxi to destination .

## 2015-07-28 NOTE — Progress Notes (Signed)
  [facility] :  Will you be returning to the same living situation after discharge:  No. At discharge, do you have transportation home?: Yes,  [employer] Do you have the ability to pay for your medications: Yes,  Mental health   Release of information consent forms completed and in the chart;  Patient's signature needed at discharge.  Patient to Follow up at: Follow-up Information    [facility] Go on 07/29/2015.   Specialty:  Behavioral Health   Why:  Your hospital follow up appointment will be walk in. Walk in hours are Monday - Friday between 8:00am and 10:00am.    Contact information:   [address] [place] [address] [phone]       Next level of care provider has access to [facility] Link:no  Safety Planning and Suicide Prevention discussed: Yes,  with patient   Have you used any form of tobacco in the last 30 days? (Cigarettes, Smokeless Tobacco, Cigars, and/or Pipes): Yes  Has patient been referred to the Quitline?: Patient refused referral  Patient has been referred for addiction treatment: Pt. refused referral  Rondall Allegraandace L Donivan Thammavong MSW, LCSWA  07/28/2015, 2:54 PM
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
## 2015-07-28 NOTE — Plan of Care (Signed)
Problem: Ineffective individual coping Goal: STG: Patient will remain free from self harm Outcome: Progressing Medications administered as ordered by the physician, medications Therapeutic Effects, SEs and Adverse effects discussed, questions encouraged; no PRN given, 15 minute checks maintained for safety, clinical and moral support provided, patient encouraged to continue to express feelings and demonstrate safe care. Patient remain free from harm, will continue to monitor.         
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
## 2015-07-28 NOTE — BHH Suicide Risk Assessment (Signed)
[facility]   Demographic Factors:  Male, Divorced or widowed, Caucasian, Low socioeconomic status and Unemployed  Total Time spent with patient: 30 minutes  Musculoskeletal: Strength & Muscle Tone: within normal limits Gait & Station: normal Patient leans: N/A  Psychiatric Specialty Exam: Physical Exam  Nursing note and vitals reviewed.   Review of Systems  All other systems reviewed and are negative.   Blood pressure 103/70, pulse 71, temperature 98.6 F (37 C), temperature source Oral, resp. rate 18, height 5\' 10"  (1.778 m), weight 78.881 kg (173 lb 14.4 oz), SpO2 100 %.Body mass index is 24.95 kg/(m^2).  General Appearance: Casual  Eye Contact::  Good  Speech:  Clear and Coherent409  Volume:  Normal  Mood:  Euthymic  Affect:  Appropriate  Thought Process:  Goal Directed  Orientation:  Full (Time, Place, and Person)  Thought Content:  WDL  Suicidal Thoughts:  No  Homicidal Thoughts:  No  Memory:  Immediate;   Fair Recent;   Fair Remote;   Fair  Judgement:  Fair  Insight:  Fair  Psychomotor Activity:  Normal  Concentration:  Fair  Recall:  [employer]  Fund of Knowledge:Fair  Language: Fair  Akathisia:  No  Handed:  Right  AIMS (if indicated):     Assets:  Communication Skills Desire for Improvement Physical Health Resilience  Sleep:  Number of Hours: 7  Cognition: WNL  ADL's:  Intact   Have you used any form of tobacco in the last 30 days? (Cigarettes, Smokeless Tobacco, Cigars, and/or Pipes): Yes  Has this patient used any form of tobacco in the last 30 days? (Cigarettes, Smokeless Tobacco, Cigars, and/or Pipes) Yes, A prescription for an FDA-approved tobacco cessation medication was offered at discharge and the patient refused  Mental Status Per Nursing Assessment::   On Admission:     Current Mental Status by Physician: NA  Loss Factors: Financial problems/change in socioeconomic status  Historical Factors: Prior suicide  attempts and Impulsivity  Risk Reduction Factors:   Positive coping skills or problem solving skills  Continued Clinical Symptoms:  Depression:   Comorbid alcohol abuse/dependence Severe Alcohol/Substance Abuse/Dependencies  Cognitive Features That Contribute To Risk:  None    Suicide Risk:  Minimal: No identifiable suicidal ideation.  Patients presenting with no risk factors but with morbid ruminations; may be classified as minimal risk based on the severity of the depressive symptoms  Principal Problem: Severe episode of recurrent major depressive disorder, without psychotic features [facility](HCC) Discharge Diagnoses:  Patient Active Problem List   Diagnosis Date Noted  . Severe episode of recurrent major depressive disorder, without psychotic features (HCC) [F33.2]   . Alcohol abuse with intoxication (HCC) [F10.129] 07/23/2015  . Major depression (HCC) [F32.9] 01/04/2012  . Alcohol abuse [F10.10] 12/29/2011  . Psychoactive substance-induced organic mood disorder (HCC) [Z61.09[F19.94, F06.30] 12/27/2011    Class: Acute  . Borderline personality disorder [F60.3] 08/02/2011  . Cannabis abuse, uncomplicated [F12.10] 08/02/2011      Plan Of Care/Follow-up recommendations:  Activity:  As tolerated. Diet:  Low sodium heart healthy. Other:  Keep follow-up appointments.  Is patient on multiple antipsychotic therapies at discharge:  No   Has Patient had three or more failed trials of antipsychotic monotherapy by history:  No  Recommended Plan for Multiple Antipsychotic Therapies: NA    Derek Weaver 07/28/2015, 9:23 AM
